# Patient Record
Sex: Female | Born: 1955 | Race: White | Hispanic: No | Smoking: Never smoker
Health system: Southern US, Community
[De-identification: ages and names within clinical notes are randomized; demographics above are authoritative.]

## PROBLEM LIST (undated history)

## (undated) DIAGNOSIS — E079 Disorder of thyroid, unspecified: Secondary | ICD-10-CM

## (undated) DIAGNOSIS — I10 Essential (primary) hypertension: Secondary | ICD-10-CM

## (undated) DIAGNOSIS — M810 Age-related osteoporosis without current pathological fracture: Secondary | ICD-10-CM

## (undated) DIAGNOSIS — E039 Hypothyroidism, unspecified: Secondary | ICD-10-CM

## (undated) HISTORY — DX: Hypothyroidism, unspecified: E03.9

## (undated) HISTORY — DX: Essential (primary) hypertension: I10

## (undated) HISTORY — PX: TONSILLECTOMY: SUR1361

## (undated) HISTORY — DX: Disorder of thyroid, unspecified: E07.9

## (undated) HISTORY — PX: CATARACT EXTRACTION: SUR2

## (undated) HISTORY — PX: COLONOSCOPY: SHX174

## (undated) HISTORY — PX: HEMORRHOID SURGERY: SHX153

## (undated) HISTORY — DX: Age-related osteoporosis without current pathological fracture: M81.0

---

## 2003-12-07 ENCOUNTER — Other Ambulatory Visit: Admission: RE | Admit: 2003-12-07 | Discharge: 2003-12-07 | Payer: Self-pay | Admitting: Family Medicine

## 2004-03-10 ENCOUNTER — Encounter: Admission: RE | Admit: 2004-03-10 | Discharge: 2004-03-10 | Payer: Self-pay | Admitting: Family Medicine

## 2004-03-23 ENCOUNTER — Encounter: Admission: RE | Admit: 2004-03-23 | Discharge: 2004-03-23 | Payer: Self-pay | Admitting: Family Medicine

## 2004-10-18 ENCOUNTER — Encounter: Admission: RE | Admit: 2004-10-18 | Discharge: 2004-10-18 | Payer: Self-pay | Admitting: Family Medicine

## 2004-12-29 ENCOUNTER — Other Ambulatory Visit: Admission: RE | Admit: 2004-12-29 | Discharge: 2004-12-29 | Payer: Self-pay | Admitting: Family Medicine

## 2005-01-02 ENCOUNTER — Encounter: Admission: RE | Admit: 2005-01-02 | Discharge: 2005-01-02 | Payer: Self-pay | Admitting: Family Medicine

## 2005-08-03 ENCOUNTER — Encounter: Admission: RE | Admit: 2005-08-03 | Discharge: 2005-08-03 | Payer: Self-pay | Admitting: Family Medicine

## 2006-03-01 ENCOUNTER — Other Ambulatory Visit: Admission: RE | Admit: 2006-03-01 | Discharge: 2006-03-01 | Payer: Self-pay | Admitting: Family Medicine

## 2009-04-15 ENCOUNTER — Encounter: Payer: Self-pay | Admitting: Family Medicine

## 2009-04-15 ENCOUNTER — Encounter: Admission: RE | Admit: 2009-04-15 | Discharge: 2009-04-15 | Payer: Self-pay | Admitting: Internal Medicine

## 2009-04-21 ENCOUNTER — Encounter: Admission: RE | Admit: 2009-04-21 | Discharge: 2009-04-21 | Payer: Self-pay | Admitting: Internal Medicine

## 2010-08-23 ENCOUNTER — Ambulatory Visit: Payer: Self-pay | Admitting: Family Medicine

## 2010-08-23 DIAGNOSIS — J069 Acute upper respiratory infection, unspecified: Secondary | ICD-10-CM | POA: Insufficient documentation

## 2010-08-23 DIAGNOSIS — E039 Hypothyroidism, unspecified: Secondary | ICD-10-CM | POA: Insufficient documentation

## 2010-08-31 ENCOUNTER — Ambulatory Visit: Payer: Self-pay | Admitting: Family Medicine

## 2010-08-31 LAB — CONVERTED CEMR LAB
Bilirubin Urine: NEGATIVE
Blood in Urine, dipstick: NEGATIVE
Glucose, Urine, Semiquant: NEGATIVE
Ketones, urine, test strip: NEGATIVE
Nitrite: NEGATIVE
Protein, U semiquant: NEGATIVE
Specific Gravity, Urine: <1.005
Urobilinogen, UA: 0.2
WBC Urine, dipstick: NEGATIVE
pH: 7.5

## 2010-09-04 LAB — CONVERTED CEMR LAB
ALT: 14 units/L (ref 0–35)
AST: 17 units/L (ref 0–37)
Albumin: 4.3 g/dL (ref 3.5–5.2)
Alkaline Phosphatase: 55 units/L (ref 39–117)
BUN: 16 mg/dL (ref 6–23)
Basophils Absolute: 0 10*3/uL (ref 0.0–0.1)
Basophils Relative: 0.8 % (ref 0.0–3.0)
Bilirubin, Direct: 0.1 mg/dL (ref 0.0–0.3)
CO2: 30 meq/L (ref 19–32)
Calcium: 9.6 mg/dL (ref 8.4–10.5)
Chloride: 105 meq/L (ref 96–112)
Cholesterol: 211 mg/dL — ABNORMAL HIGH (ref 0–200)
Creatinine, Ser: 0.7 mg/dL (ref 0.4–1.2)
Direct LDL: 110.1 mg/dL
Eosinophils Absolute: 0.2 10*3/uL (ref 0.0–0.7)
Eosinophils Relative: 6.6 % — ABNORMAL HIGH (ref 0.0–5.0)
GFR calc non Af Amer: 100.09 mL/min (ref >60)
Glucose, Bld: 105 mg/dL — ABNORMAL HIGH (ref 70–99)
HCT: 37 % (ref 36.0–46.0)
HDL: 95 mg/dL (ref >39.00)
Hemoglobin: 13 g/dL (ref 12.0–15.0)
Lymphocytes Relative: 29.8 % (ref 12.0–46.0)
Lymphs Abs: 1 10*3/uL (ref 0.7–4.0)
MCHC: 35.2 g/dL (ref 30.0–36.0)
MCV: 89.1 fL (ref 78.0–100.0)
Monocytes Absolute: 0.2 10*3/uL (ref 0.1–1.0)
Monocytes Relative: 6 % (ref 3.0–12.0)
Neutro Abs: 1.8 10*3/uL (ref 1.4–7.7)
Neutrophils Relative %: 56.8 % (ref 43.0–77.0)
Platelets: 159 10*3/uL (ref 150.0–400.0)
Potassium: 4.4 meq/L (ref 3.5–5.1)
RBC: 4.15 M/uL (ref 3.87–5.11)
RDW: 12.8 % (ref 11.5–14.6)
Sodium: 140 meq/L (ref 135–145)
T3, Free: 2.9 pg/mL (ref 2.3–4.2)
TSH: 0.69 microintl units/mL (ref 0.35–5.50)
Total Bilirubin: 0.8 mg/dL (ref 0.3–1.2)
Total CHOL/HDL Ratio: 2
Total Protein: 6.8 g/dL (ref 6.0–8.3)
Triglycerides: 51 mg/dL (ref 0.0–149.0)
VLDL: 10.2 mg/dL (ref 0.0–40.0)
WBC: 3.3 10*3/uL — ABNORMAL LOW (ref 4.5–10.5)

## 2010-09-08 ENCOUNTER — Telehealth (INDEPENDENT_AMBULATORY_CARE_PROVIDER_SITE_OTHER): Payer: Self-pay | Admitting: *Deleted

## 2010-10-05 ENCOUNTER — Encounter: Payer: Self-pay | Admitting: Family Medicine

## 2010-10-05 ENCOUNTER — Ambulatory Visit: Payer: Self-pay | Admitting: Family Medicine

## 2010-10-05 ENCOUNTER — Other Ambulatory Visit: Admission: RE | Admit: 2010-10-05 | Discharge: 2010-10-05 | Payer: Self-pay | Admitting: Family Medicine

## 2010-10-05 DIAGNOSIS — M810 Age-related osteoporosis without current pathological fracture: Secondary | ICD-10-CM | POA: Insufficient documentation

## 2010-10-05 LAB — CONVERTED CEMR LAB: Pap Smear: NEGATIVE

## 2011-01-04 NOTE — Assessment & Plan Note (Signed)
Summary: new to estab/cbs   Vital Signs:  Patient profile:   55 year old female Height:      63.5 inches Weight:      132.0 pounds BMI:     23.10 Temp:     99.9 degrees F oral Pulse rate:   88 / minute Pulse rhythm:   regular BP sitting:   140 / 74  (left arm) Cuff size:   regular  Vitals Entered By: Almeta Monas CMA [facility]) (August 23, 2010 10:02 AM) CC: New est care. C/o sinus headache and congestion, URI symptoms Comments Colonoscopy  in 2008, mammaogram in 2010 and pap in 2010   History of Present Illness: Pt here to establish and have thyroid checked .   Pt notices her hair is getting thin and her nails are brittle.           This is a 55 year old woman who presents with URI symptoms.  The symptoms began 1 day ago.  The patient complains of nasal congestion, but denies clear nasal discharge, purulent nasal discharge, sore throat, dry cough, productive cough, earache, and sick contacts.  The patient also reports itchy watery eyes and sneezing.  The patient denies itchy throat, seasonal symptoms, response to antihistamine, headache, muscle aches, and severe fatigue.  The patient denies the following risk factors for Strep sinusitis: unilateral facial pain, unilateral nasal discharge, poor response to decongestant, double sickening, tooth pain, Strep exposure, tender adenopathy, and absence of cough.    Preventive Screening-Counseling & Management  Alcohol-Tobacco     Alcohol drinks/day: 1     Alcohol type: wine     Smoking Status: never  Caffeine-Diet-Exercise     Caffeine use/day: 2     Does Patient Exercise: no     Exercise Counseling: to improve exercise regimen      Drug Use:  no.    Current Medications (verified): 1)  Levoxyl 125 Mcg Tabs (Levothyroxine Sodium) .Marland Kitchen.. 1 By Mouth Once Daily 2)  Mvi .Marland Kitchen.. 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 Family History Hypertension--Mother and Brother Family History of Prostate CA 1st degree relative <50-- Father Family History Diabetes 1st degree relative  Social History: Reviewed history and no changes required. 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  Otoscopic examination reveals clear canals, tympanic membranes are intact bilaterally without bulging, retraction, inflammation or discharge. Hearing is grossly normal bilaterally. Nose:  no external deformity, mucosal erythema, and mucosal edema.   Mouth:  Oral mucosa and oropharynx without lesions or exudates.  Teeth in good repair. Neck:  No deformities, masses, or tenderness noted. Lungs:  Normal respiratory effort, chest expands symmetrically. Lungs are clear to auscultation, no crackles or wheezes. Heart:  Normal rate and regular rhythm. S1 and S2 normal  without gallop, murmur, click, rub or other extra sounds. Cervical Nodes:  ant cervical nodes slightly enlarged--nontender Psych:  Cognition and judgment appear intact. Alert and cooperative with normal attention span and concentration. No apparent delusions, illusions, hallucinations   Impression & Recommendations:  Problem # 1:  URI (ICD-465.9)  veramyst  astepro claritin  Her updated medication list for this problem includes:    Claritin 10 Mg Tabs (Loratadine) .Marland Kitchen... 1 by mouth once daily  Problem # 2:  HYPOTHYROIDISM (ICD-244.9)  Her updated medication list for this problem includes:    Levoxyl 125 Mcg Tabs (Levothyroxine sodium) .Marland Kitchen... 1 by mouth once daily  Complete Medication List: 1)  Levoxyl 125 Mcg Tabs (Levothyroxine sodium) .Marland Kitchen.. 1 by mouth once daily 2)  Mvi  .Marland Kitchen.. 1 by mouth qd 3)  Calcium 600 1500 Mg Tabs (Calcium carbonate) .Marland Kitchen.. 1 by mouth two times a day 4)  Vitamin D3 1000 Unit Caps (Cholecalciferol) .Marland Kitchen.. 1 by mouth once daily 5)  Veramyst 27.5 Mcg/spray Susp (Fluticasone furoate) .... 2 sprays each nostril once daily 6)  Astepro 0.15 % Soln (Azelastine hcl) .... 2 spray each nostril once daily 7)  Claritin 10 Mg Tabs (Loratadine) .Marland Kitchen.. 1 by mouth once daily  Patient Instructions: 1)  fasting labs  v70.0 244.9  --cbcd, hep, lipid, bmp, TSH, free T3 ,  free T4 ,  UA  2)  schedule Cpe  Colonoscopy Result Date:  04/17/2006 Colonoscopy Result:  normal-- HP Colonoscopy Next Due:  10 yr  Appended Document: new to estab/cbs    Clinical Lists Changes  Observations: Added new observation of MAMMO DUE: 04/15/2010 (08/24/2010 13:45) Added new observation of LAST MAM DAT: 04/15/2009 (04/15/2009 13:45) Added new observation of MAMMOGRAM: normal (04/15/2009 13:45)      Mammogram Result Date:  04/15/2009 Mammogram Result:  normal Mammogram Next Due:  1 yr

## 2011-01-04 NOTE — Progress Notes (Signed)
Summary: Questions about labs 10/7, 10/10  Phone Note Call from Patient   Caller: Patient Details for Reason: QUESTIONS ABOUT LABS Summary of Call: Mssg from pt and she wanted to discuss labs.   Almeta Monas CMA [facility])  September 08, 2010 9:32 AM  Left message to call back.     Almeta Monas CMA [facility])  September 08, 2010 9:32 AM    Follow-up for Phone Call        pt wanted to know if you could think about adjusting her meds, per pt she feels like her Thyroid values are a little on the low side. Adv pt that her labs were wnl, pt voiced understanding but sayd her hair is still shedding and thinks her meds need to be adjusted. Pt also did not want to wait three months to have thyroid rechecked...c/b [phone] Please advise Follow-up by: Almeta Monas CMA [facility]),  September 08, 2010 9:56 AM  Additional Follow-up for Phone Call Additional follow up Details #1::        if I increase med---the TSH will be lower and she will be hyperthyroid---when the TSH is low that is hyperthyroid when it is hight that is hypothyroid---I 'll be happy to refer her to a specialist for her hair loss but it is not her thyroid.   Additional Follow-up by: Loreen Freud DO,  September 08, 2010 10:05 AM    Additional Follow-up for Phone Call Additional follow up Details #2::    Left message to call back..... Almeta Monas CMA [facility])  September 08, 2010 11:43 AM  Left message to call back...Marland KitchenMarland KitchenMarland Kitchen Almeta Monas CMA [facility])  September 11, 2010 12:01 PM   Additional Follow-up for Phone Call Additional follow up Details #3:: Details for Additional Follow-up Action Taken: spk with pt and made her aware of Dr.Lowne recommendations,pt did not agree. Thinks meds need to be adjusted. She  said she will talk to her husband about seeing and Endocrinologist. Will call back if she needs a referral. Offered appt to discuss with Dr.Lowne and pt declined. Call ended Additional Follow-up by: Almeta Monas CMA [facility]),  September 11, 2010 3:27  PM

## 2011-01-04 NOTE — Assessment & Plan Note (Signed)
Summary: cpx/cbs   Vital Signs:  Patient profile:   55 year old female Height:      63.5 inches Weight:      133.8 pounds Temp:     97.4 degrees F oral Pulse rate:   60 / minute Pulse rhythm:   regular BP sitting:   120 / 72  (left arm) Cuff size:   regular  Vitals Entered By: Almeta Monas CMA [facility]) (October 05, 2010 8:39 AM) CC: CPX WITH PAP--- Declined Flu vaccine   History of Present Illness: Pt here for cpe and pap --labs done last visit.   Labs reviewed with pt.   No new complaints.    Preventive Screening-Counseling & Management  Alcohol-Tobacco     Alcohol drinks/day: 1     Alcohol type: wine     Smoking Status: never  Caffeine-Diet-Exercise     Caffeine use/day: 2     Does Patient Exercise: no     Exercise Counseling: to improve exercise regimen  Hep-HIV-STD-Contraception     Dental Visit-last 6 months yes     Dental Care Counseling: not indicated; dental care within six months     SBE monthly: no     SBE Education/Counseling: to perform regular SBE      Sexual History:  currently monogamous.    Problems Prior to Update: 1)  Preventive Health Care  (ICD-V70.0) 2)  Osteopenia  (ICD-733.90) 3)  Uri  (ICD-465.9) 4)  Family History Diabetes 1st Degree Relative  (ICD-V18.0) 5)  Hypothyroidism  (ICD-244.9)  Medications Prior to Update: 1)  Levoxyl 125 Mcg Tabs (Levothyroxine Sodium) .Marland Kitchen.. 1 By Mouth Once Daily 2)  Mvi .Marland Kitchen.. 1 By Mouth Qd 3)  Calcium 600 1500 Mg Tabs (Calcium Carbonate) .Marland Kitchen.. 1 By Mouth Two Times A Day 4)  Vitamin D3 1000 Unit Caps (Cholecalciferol) .Marland Kitchen.. 1 By Mouth Once Daily 5)  Veramyst 27.5 Mcg/spray Susp (Fluticasone Furoate) .... 2 Sprays Each Nostril Once Daily 6)  Astepro 0.15 % Soln (Azelastine Hcl) .... 2 Spray Each Nostril Once Daily 7)  Claritin 10 Mg Tabs (Loratadine) .Marland Kitchen.. 1 By Mouth Once Daily  Current Medications (verified): 1)  Levoxyl 125 Mcg Tabs (Levothyroxine Sodium) .Marland Kitchen.. 1 By Mouth Once Daily 2)  Mvi .Marland Kitchen.. 1 By Mouth  Qd 3)  Calcium 600 1500 Mg Tabs (Calcium Carbonate) .Marland Kitchen.. 1 By Mouth Two Times A Day 4)  Vitamin D3 1000 Unit Caps (Cholecalciferol) .Marland Kitchen.. 1 By Mouth Once Daily 5)  Veramyst 27.5 Mcg/spray Susp (Fluticasone Furoate) .... 2 Sprays Each Nostril Once Daily 6)  Astepro 0.15 % Soln (Azelastine Hcl) .... 2 Spray Each Nostril Once Daily 7)  Claritin 10 Mg Tabs (Loratadine) .Marland Kitchen.. 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 Family History Hypertension--Mother and Brother Family History of Prostate CA 1st degree relative <50-- Father Family History Diabetes 1st degree relative  Social History: Reviewed history from 08/23/2010 and no changes required. 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 Eyes:  Denies blurring, discharge, double vision, eye irritation, eye pain, halos, itching,  light sensitivity, red eye, vision loss-1 eye, and vision loss-both eyes; optho-  + contacts---q1y. ENT:  Denies decreased hearing, difficulty swallowing, ear discharge, earache, hoarseness, nasal congestion, nosebleeds, postnasal drainage, ringing in ears, sinus pressure, and sore throat. CV:  Denies bluish discoloration of lips or nails, chest pain or discomfort, difficulty breathing at night, difficulty breathing while lying down, fainting, fatigue, leg cramps with exertion, lightheadness, near fainting, palpitations, shortness of breath with exertion, swelling of feet, swelling of hands, and weight gain. Resp:  Denies chest discomfort, chest pain with inspiration, cough, coughing up blood, excessive snoring, hypersomnolence, morning headaches, pleuritic, shortness of breath, sputum productive, and wheezing. GI:  Denies abdominal pain, bloody stools, change in bowel habits, constipation, dark tarry stools, diarrhea, excessive appetite, gas, hemorrhoids, indigestion, loss of appetite, nausea, vomiting, vomiting blood, and yellowish skin color. GU:  Denies abnormal vaginal bleeding, decreased libido, discharge, dysuria, genital sores, hematuria, incontinence, nocturia, urinary frequency, and urinary hesitancy. MS:  Denies joint pain, joint redness, joint swelling, loss of strength, low back pain, mid back pain, muscle aches, muscle , cramps, muscle weakness, stiffness, and thoracic pain. Derm:  Denies changes in color of skin, changes in nail beds, dryness, excessive perspiration, flushing, hair loss, insect bite(s), itching, lesion(s), poor wound healing, and rash; derm -- Dr Yetta Barre. Neuro:  Denies brief paralysis, difficulty with concentration, disturbances in coordination, falling down, headaches, inability to speak, memory loss, numbness, poor balance, seizures, sensation of room spinning, tingling, tremors, visual disturbances, and weakness. Psych:  Denies alternate hallucination ( auditory/visual),  anxiety, depression, easily angered, easily tearful, irritability, mental problems, panic attacks, sense of great danger, suicidal thoughts/plans, thoughts of violence, unusual visions or sounds, and thoughts /plans of harming others. Endo:  Denies cold intolerance, excessive hunger, excessive thirst, excessive urination, heat intolerance, polyuria, and weight change. Heme:  Denies abnormal bruising, bleeding, enlarge lymph nodes, fevers, pallor, and skin discoloration. Allergy:  Denies hives or rash, itching eyes, persistent infections, seasonal allergies, and sneezing.  Physical Exam  General:  Well-developed,well-nourished,in no acute distress; alert,appropriate and cooperative throughout examination Head:  Normocephalic and atraumatic without obvious abnormalities. No apparent alopecia or balding. Eyes:  pupils equal, pupils round, pupils reactive to light, and no injection.   Ears:  External ear exam shows no significant lesions or deformities.  Otoscopic examination reveals clear canals, tympanic membranes are intact bilaterally without bulging, retraction, inflammation or discharge. Hearing is grossly normal bilaterally. Nose:  External nasal examination shows no deformity or inflammation. Nasal mucosa are pink and moist without lesions or exudates. Mouth:  Oral mucosa and oropharynx without lesions or exudates.  Teeth in good repair. Neck:  No deformities, masses, or tenderness noted.no carotid bruits.   Chest Wall:  No deformities, masses, or tenderness noted. Breasts:  No mass, nodules, thickening, tenderness, bulging, retraction, inflamation, nipple discharge or skin changes noted.   Lungs:  Normal respiratory effort, chest expands symmetrically. Lungs are clear to auscultation, no crackles or wheezes. Heart:  normal rate and no murmur.   Abdomen:  Bowel sounds positive,abdomen soft and non-tender without masses, organomegaly or hernias noted. Rectal:  No external abnormalities noted.  Normal sphincter tone. No rectal masses or tenderness.  heme neg brown stool Genitalia:  Pelvic Exam:        External: normal female genitalia without lesions or masses        Vagina: normal without lesions or masses        Cervix: normal without lesions or masses        Adnexa: normal bimanual exam without masses or fullness        Uterus: normal by palpation        Pap smear: performed Msk:  normal ROM, no joint tenderness, no joint swelling, no joint warmth, no redness over joints, no joint deformities, no joint instability, and no crepitation.   Pulses:  R posterior tibial normal, R dorsalis pedis normal, R carotid normal, L posterior tibial normal, L dorsalis pedis normal, and L carotid normal.   Extremities:  No clubbing, cyanosis, edema, or deformity noted with normal full range of motion of all joints.   Neurologic:  No cranial nerve deficits noted. Station and gait are normal. Plantar reflexes are down-going bilaterally. DTRs are symmetrical throughout. Sensory, motor and coordinative functions appear intact. Skin:  Intact without suspicious lesions or rashes Cervical Nodes:  No lymphadenopathy noted Axillary Nodes:  No palpable lymphadenopathy Psych:  Cognition and judgment appear intact. Alert and cooperative with normal attention span and concentration. No apparent delusions, illusions, hallucinations   Impression & Recommendations:  Problem # 1:  PREVENTIVE HEALTH CARE (ICD-V70.0)  labs reviewed with pt ghm utd  check mammo check on bmd  Orders: EKG w/ Interpretation (93000)  Problem # 2:  OSTEOPENIA (ICD-733.90)  Pt will check to see when last bmd done Her updated medication list for this problem includes:    Calcium 600 1500 Mg Tabs (Calcium carbonate) .Marland Kitchen... 1 by mouth two times a day    Vitamin D3 1000 Unit Caps (Cholecalciferol) .Marland Kitchen... 1 by mouth once daily  Orders: EKG w/ Interpretation (93000)  Problem # 3:  HYPOTHYROIDISM (ICD-244.9)  Her updated  medication list for this problem includes:    Levoxyl 125 Mcg Tabs (Levothyroxine sodium) .Marland Kitchen... 1 by mouth once daily  Labs Reviewed: TSH: 0.69 (08/31/2010)    Chol: 211 (08/31/2010)   HDL: 95.00 (08/31/2010)   TG: 51.0 (08/31/2010)  Orders: EKG w/ Interpretation (93000)  Complete Medication List: 1)  Levoxyl 125 Mcg Tabs (Levothyroxine sodium) .Marland Kitchen.. 1 by mouth once daily 2)  Mvi  .Marland Kitchen.. 1 by mouth qd 3)  Calcium 600 1500 Mg Tabs (Calcium carbonate) .Marland Kitchen.. 1 by mouth two times a day 4)  Vitamin D3 1000 Unit Caps (Cholecalciferol) .Marland Kitchen.. 1 by mouth once daily 5)  Veramyst 27.5 Mcg/spray Susp (Fluticasone furoate) .... 2 sprays each nostril once daily 6)  Astepro 0.15 % Soln (Azelastine hcl) .... 2 spray each nostril once daily 7)  Claritin 10 Mg Tabs (Loratadine) .Marland Kitchen.. 1 by mouth once daily  Other Orders: Tdap => 67yrs IM (09811) Admin 1st Vaccine (91478)  Patient Instructions: 1)  fasting labs 3 months  272.4  [facility]   Orders Added: 1)  Tdap => 20yrs IM [90715] 2)  Admin 1st Vaccine [90471] 3)  Est. Patient 40-64 years [99396] 4)  EKG w/ Interpretation [93000]   Immunizations Administered:  Tetanus Vaccine:    Vaccine Type: Tdap    Site: right deltoid    Mfr: Merck    Dose:  0.5 ml    Route: IM    Given by: Almeta Monas CMA (AAMA)    Exp. Date: 09/21/2012    Lot #: [id number]    VIS given: 10/20/08 version given October 05, 2010.   Immunizations Administered:  Tetanus Vaccine:    Vaccine Type: Tdap    Site: right deltoid    Mfr: Merck    Dose: 0.5 ml    Route: IM    Given by: Almeta Monas CMA (AAMA)    Exp. Date: 09/21/2012    Lot #: [id number]    VIS given: 10/20/08 version given October 05, 2010.  Flu Vaccine Result Date:  09/14/2010 Flu Vaccine Result:  given Flu Vaccine Next Due:  1 yr TD Result Date:  10/05/2010 TD Result:  given TD Next Due:  10 yr

## 2011-01-08 ENCOUNTER — Encounter (INDEPENDENT_AMBULATORY_CARE_PROVIDER_SITE_OTHER): Payer: Self-pay | Admitting: *Deleted

## 2011-01-08 ENCOUNTER — Other Ambulatory Visit (INDEPENDENT_AMBULATORY_CARE_PROVIDER_SITE_OTHER): Payer: BC Managed Care – PPO

## 2011-01-08 DIAGNOSIS — E039 Hypothyroidism, unspecified: Secondary | ICD-10-CM

## 2011-01-08 DIAGNOSIS — Z0000 Encounter for general adult medical examination without abnormal findings: Secondary | ICD-10-CM

## 2011-01-23 ENCOUNTER — Telehealth (INDEPENDENT_AMBULATORY_CARE_PROVIDER_SITE_OTHER): Payer: Self-pay | Admitting: *Deleted

## 2011-01-30 NOTE — Progress Notes (Signed)
Summary: Results of [facility]--  left message to call back.... Almeta Monas CMA [facility])  January 23, 2011 9:03 AM      ---- Converted from flag ---- ---- 01/21/2011 6:44 PM, Loreen Freud DO wrote: her [facility] was great!  she can make appointment to go over them or just pick up the book and make appointment if she has questions. ------------------------------  Phone Note Call from Patient   Caller: Patient Summary of Call: Pt would like to pick of labs first then if she has any question she will schedule OV to discuss............Marland KitchenFelecia Deloach CMA  January 24, 2011 8:19 AM   Follow-up for Phone Call        [facility] book left at check In.... Almeta Monas CMA [facility] Dull)  January 24, 2011 8:26 AM

## 2011-02-28 ENCOUNTER — Other Ambulatory Visit: Payer: Self-pay | Admitting: Family Medicine

## 2011-02-28 DIAGNOSIS — Z1231 Encounter for screening mammogram for malignant neoplasm of breast: Secondary | ICD-10-CM

## 2011-03-08 ENCOUNTER — Other Ambulatory Visit: Payer: Self-pay

## 2011-03-08 MED ORDER — LEVOTHYROXINE SODIUM 125 MCG PO TABS: 125.0000 ug | ORAL_TABLET | Freq: Every day | ORAL | Status: DC

## 2011-04-17 ENCOUNTER — Telehealth: Payer: Self-pay | Admitting: *Deleted

## 2011-04-17 DIAGNOSIS — M8580 Other specified disorders of bone density and structure, unspecified site: Secondary | ICD-10-CM

## 2011-04-17 DIAGNOSIS — Z1231 Encounter for screening mammogram for malignant neoplasm of breast: Secondary | ICD-10-CM

## 2011-04-17 NOTE — Telephone Encounter (Signed)
Pt called stating that she would like to have bone density and mammogram order sent to [facility] so she can have them done on the same day. Pt aware orders put in.

## 2011-05-25 ENCOUNTER — Telehealth: Payer: Self-pay | Admitting: *Deleted

## 2011-05-25 NOTE — Telephone Encounter (Signed)
error 

## 2011-05-25 NOTE — Telephone Encounter (Signed)
Addended by: Candie Echevaria L on: 05/25/2011 11:38 AM   Modules accepted: Orders

## 2011-06-15 ENCOUNTER — Other Ambulatory Visit: Payer: BC Managed Care – PPO

## 2011-06-15 ENCOUNTER — Ambulatory Visit
Admission: RE | Admit: 2011-06-15 | Discharge: 2011-06-15 | Disposition: A | Discharge status: Home / Self Care | Payer: BC Managed Care – PPO | Source: Ambulatory Visit | Attending: Family Medicine | Admitting: Family Medicine

## 2011-07-06 ENCOUNTER — Ambulatory Visit
Admission: RE | Admit: 2011-07-06 | Discharge: 2011-07-06 | Disposition: A | Discharge status: Home / Self Care | Payer: BC Managed Care – PPO | Source: Ambulatory Visit | Attending: Family Medicine | Admitting: Family Medicine

## 2011-07-06 DIAGNOSIS — M8580 Other specified disorders of bone density and structure, unspecified site: Secondary | ICD-10-CM

## 2011-07-20 ENCOUNTER — Telehealth: Payer: Self-pay | Admitting: Family Medicine

## 2011-07-20 NOTE — Telephone Encounter (Signed)
Atelvia 35  1 po qweek  #4  11 refills---coupons in closet

## 2011-07-20 NOTE — Telephone Encounter (Signed)
Pt states that she does not like the fosamax. Pt is requesting another med..Please advise

## 2011-07-23 NOTE — Telephone Encounter (Signed)
Left message to call office

## 2011-07-24 NOTE — Telephone Encounter (Signed)
Left message to call office

## 2011-07-25 MED ORDER — RISEDRONATE SODIUM 35 MG PO TBEC: 1.0000 | DELAYED_RELEASE_TABLET | ORAL | Status: DC

## 2011-07-25 NOTE — Telephone Encounter (Signed)
Left message to call office

## 2011-07-25 NOTE — Telephone Encounter (Signed)
Discussed with patient and she voiced understanding. Rx sent to pharmacy     KP

## 2011-07-30 ENCOUNTER — Telehealth: Payer: Self-pay | Admitting: Family Medicine

## 2011-07-30 ENCOUNTER — Encounter: Payer: Self-pay | Admitting: Family Medicine

## 2011-07-30 NOTE — Telephone Encounter (Signed)
Fosamax is what I ordered to begin with

## 2011-07-30 NOTE — Telephone Encounter (Signed)
Received prior auth from pharmacy pt can try Alendronate, Fosamax plus D, or Boniva. Since she hasn't tried any other medication pt would have to try one of these. Pls advise.

## 2011-07-30 NOTE — Telephone Encounter (Signed)
msgs left for a return call     KP

## 2011-07-31 NOTE — Telephone Encounter (Signed)
Pt tried fosamax and didn"t "like it"---- find out exactly why and maybe we can get it approved then---thankyou

## 2011-07-31 NOTE — Telephone Encounter (Signed)
Dr. Laury Axon see phone note from 8/17 pt doesn't like fosamax so your recommendation was atelvia which isn't approved.

## 2011-08-13 NOTE — Telephone Encounter (Signed)
mssg left with guy who answered    KP

## 2011-08-15 NOTE — Telephone Encounter (Signed)
mssg left to call the office    KP 

## 2011-08-15 NOTE — Telephone Encounter (Signed)
Prior auth faxed awaiting fax.

## 2011-08-17 NOTE — Telephone Encounter (Signed)
discussed with patent and she stated she did not want to get the Actonel and the Fosamax caused her joint pain. She wanted to know some other options and I discussed Reclast and Prolia. She stated her insurance will hanged on October 1st and she will decide then, I advised I would mailed her some patient information on both Prolia and Reclast and she stated she would call back and let us know when she makes her choice      KP

## 2011-09-05 LAB — HM MAMMOGRAPHY: HM Mammogram: NORMAL

## 2011-10-04 ENCOUNTER — Other Ambulatory Visit: Payer: Self-pay | Admitting: Family Medicine

## 2012-01-21 ENCOUNTER — Ambulatory Visit (INDEPENDENT_AMBULATORY_CARE_PROVIDER_SITE_OTHER): Payer: BC Managed Care – PPO | Admitting: Family Medicine

## 2012-01-21 VITALS — BP 127/72 | HR 76 | Temp 99.0°F | Resp 16 | Ht 63.5 in | Wt 141.4 lb

## 2012-01-21 DIAGNOSIS — E039 Hypothyroidism, unspecified: Secondary | ICD-10-CM | POA: Insufficient documentation

## 2012-01-21 DIAGNOSIS — J019 Acute sinusitis, unspecified: Secondary | ICD-10-CM

## 2012-01-21 DIAGNOSIS — J069 Acute upper respiratory infection, unspecified: Secondary | ICD-10-CM

## 2012-01-21 MED ORDER — FLUTICASONE PROPIONATE 50 MCG/ACT NA SUSP: NASAL | Status: DC

## 2012-01-21 MED ORDER — CEFDINIR 300 MG PO CAPS: 300.0000 mg | ORAL_CAPSULE | Freq: Two times a day (BID) | ORAL | Status: AC

## 2012-01-21 NOTE — Progress Notes (Signed)
  Subjective:    Patient ID: Linda Gay, female    DOB: Jun 07, 1956, 56 y.o.   MRN: [id number]  HPI First respiratory congestion and sinus infection for the past week. Has not been where running any fever at is like she has a lot of postnasal drainage. There's pressure her face. Cough is primarily from the drainage. She is a nonsmoker.  Her only regular medicine is the thyroid medication. She is due to recheck of that, for the dose was changed about   Review of Systems    as above Objective:   Physical Exam  Pleasant white female no acute distress. Her TMs are normal throat slightly erythematous neck supple without nodes. Her sinuses are tender on percussion no thyromegaly. Chest is clear to auscultation. Heart regular.      Assessment & Plan:  Sinusitis Hypothyroidism URI  Checked a TSH and treat her with antibiotics

## 2012-01-21 NOTE — Patient Instructions (Signed)
Drink lots of fluids. Use the medications as ordered. Return if worse  .Upper Respiratory Infection, Adult An upper respiratory infection (URI) is also known as the common cold. It is often caused by a type of germ (virus). Colds are easily spread (contagious). You can pass it to others by kissing, coughing, sneezing, or drinking out of the same glass. Usually, you get better in 1 or 2 weeks.  HOME CARE   Only take medicine as told by your doctor.   Use a warm mist humidifier or breathe in steam from a hot shower.   Drink enough water and fluids to keep your pee (urine) clear or pale yellow.   Get plenty of rest.   Return to work when your temperature is back to normal or as told by your doctor. You may use a face mask and wash your hands to stop your cold from spreading.  GET HELP RIGHT AWAY IF:   After the first few days, you feel you are getting worse.   You have questions about your medicine.   You have chills, shortness of breath, or brown or red spit (mucus).   You have yellow or brown snot (nasal discharge) or pain in the face, especially when you bend forward.   You have a fever, puffy (swollen) neck, pain when you swallow, or white spots in the back of your throat.   You have a bad headache, ear pain, sinus pain, or chest pain.   You have a high-pitched whistling sound when you breathe in and out (wheezing).   You have a lasting cough or cough up blood.   You have sore muscles or a stiff neck.  MAKE SURE YOU:   Understand these instructions.   Will watch your condition.   Will get help right away if you are not doing well or get worse.  Document Released: 05/07/2008 Document Revised: 08/01/2011 Document Reviewed: 03/26/2011 [facility] Patient Information 2012 [place], [place].

## 2012-01-22 ENCOUNTER — Encounter: Payer: Self-pay | Admitting: Family Medicine

## 2012-01-22 LAB — TSH: TSH: 3.611 u[IU]/mL (ref 0.350–4.500)

## 2012-01-30 ENCOUNTER — Other Ambulatory Visit: Payer: Self-pay | Admitting: Family Medicine

## 2012-04-03 ENCOUNTER — Telehealth: Payer: Self-pay | Admitting: *Deleted

## 2012-04-03 NOTE — Telephone Encounter (Signed)
The pharmacist from [facility] called and stated that Levoxyl was on back order and unable to get in.  They would like to know if it can be changed to Synthroid?

## 2012-04-04 NOTE — Telephone Encounter (Signed)
It's ok with [place] if she is changed to Synthroid but that is up to her.

## 2012-04-05 NOTE — Telephone Encounter (Signed)
PHARMACY AND PT NOTIFIED

## 2012-04-05 NOTE — Telephone Encounter (Signed)
Pt is calling back to see if we have made a decision about  Her medication change since what she is currently taking now is unavailable  Best number [phone]

## 2012-06-20 ENCOUNTER — Ambulatory Visit (INDEPENDENT_AMBULATORY_CARE_PROVIDER_SITE_OTHER): Payer: BC Managed Care – PPO | Admitting: Family Medicine

## 2012-06-20 VITALS — BP 132/66 | HR 67 | Temp 98.3°F | Resp 16 | Ht 64.0 in | Wt 142.2 lb

## 2012-06-20 DIAGNOSIS — E039 Hypothyroidism, unspecified: Secondary | ICD-10-CM

## 2012-06-20 LAB — TSH: TSH: 2.743 u[IU]/mL (ref 0.350–4.500)

## 2012-06-20 MED ORDER — LEVOTHYROXINE SODIUM 100 MCG PO TABS: 100.0000 ug | ORAL_TABLET | Freq: Every day | ORAL | Status: DC

## 2012-06-20 NOTE — Progress Notes (Signed)
[facility] [address], [place] [address] [phone]  Date:  06/20/2012   Name:  Linda Gay   DOB:  02-Oct-1956   MRN:  [id number]  PCP:  Loreen Freud, DO    Chief Complaint: Hypothyroidism   History of Present Illness:  Linda Gay is a 56 y.o. very pleasant female patient who presents with the following:  Here today for a TSH check.  She had her dosage adjusted in December of 2012 due to a suppressed TSH (went from 125 to ).  Her weight is relatively stable and she is feeling well.  Still has a few days of her last rx left so she will not run out.   She is otherwise generally healthy   Patient Active Problem List  Diagnosis  . HYPOTHYROIDISM  . URI  . OSTEOPENIA  . Hypothyroid    No past medical history on file.  No past surgical history on file.  History  Substance Use Topics  . Smoking status: Never Smoker   . Smokeless tobacco: Not on file  . Alcohol Use: Not on file    No family history on file.  No Known Allergies  Medication list has been reviewed and updated.  Current Outpatient Prescriptions on File Prior to Visit  Medication Sig Dispense Refill  . LEVOXYL 100 MCG tablet TAKE ONE TABLET BY MOUTH DAILY  90 tablet  0  . DISCONTD: fluticasone (FLONASE) 50 MCG/ACT nasal spray Use 2 sprays each nostril twice daily for 3 days, then just once daily  16 g  1    Review of Systems:  As per HPI- otherwise negative.   Physical Examination: Filed Vitals:   06/20/12 0835  BP: 132/66  Pulse: 67  Temp: 98.3 F (36.8 C)  Resp: 16   Filed Vitals:   06/20/12 0835  Height: 5\' 4"  (1.626 m)  Weight: 142 lb 3.2 oz (64.501 kg)   Body mass index is 24.41 kg/(m^2). Ideal Body Weight: Weight in (lb) to have BMI = 25: 145.3   GEN: WDWN, NAD, Non-toxic, A & O x 3 HEENT: Atraumatic, Normocephalic. Neck supple. No masses, No LAD.  PEERL, EOMI, oropharynx wnl.  Ears and Nose: No external deformity. CV: RRR, No M/G/R. No JVD. No  thrill. No extra heart sounds. PULM: CTA B, no wheezes, crackles, rhonchi. No retractions. No resp. distress. No accessory muscle use. EXTR: No c/c/e NEURO Normal gait.  PSYCH: Normally interactive. Conversant. Not depressed or anxious appearing.  Calm demeanor.    Assessment and Plan: 1. Hypothyroidism  levothyroxine (LEVOXYL) 100 MCG tablet, TSH   Check TSH today.  Assuming ok will plan to recheck in about 6 months.  She had a normal TSH in February of this year.  Refilled her medication but she will not pick it up until we discuss her TSH    Abbe Amsterdam, MD

## 2012-11-04 ENCOUNTER — Other Ambulatory Visit: Payer: Self-pay | Admitting: Family Medicine

## 2013-03-23 ENCOUNTER — Ambulatory Visit (INDEPENDENT_AMBULATORY_CARE_PROVIDER_SITE_OTHER): Payer: BC Managed Care – PPO | Admitting: Emergency Medicine

## 2013-03-23 VITALS — BP 122/60 | HR 67 | Temp 98.1°F | Resp 16 | Ht 64.0 in | Wt 142.0 lb

## 2013-03-23 DIAGNOSIS — E039 Hypothyroidism, unspecified: Secondary | ICD-10-CM

## 2013-03-23 LAB — POCT CBC
Granulocyte percent: 63.1 %G (ref 37–80)
HCT, POC: 36.6 % — AB (ref 37.7–47.9)
Hemoglobin: 11.7 g/dL — AB (ref 12.2–16.2)
Lymph, poc: 1 (ref 0.6–3.4)
MCH, POC: 29 pg (ref 27–31.2)
MCHC: 32 g/dL (ref 31.8–35.4)
MCV: 90.7 fL (ref 80–97)
MID (cbc): 0.2 (ref 0–0.9)
MPV: 9.2 fL (ref 0–99.8)
POC Granulocyte: 2.1 (ref 2–6.9)
POC LYMPH PERCENT: 29.6 %L (ref 10–50)
POC MID %: 7.3 %M (ref 0–12)
Platelet Count, POC: 165 10*3/uL (ref 142–424)
RBC: 4.03 M/uL — AB (ref 4.04–5.48)
RDW, POC: 13.7 %
WBC: 3.3 10*3/uL — AB (ref 4.6–10.2)

## 2013-03-23 LAB — COMPREHENSIVE METABOLIC PANEL
ALT: 14 U/L (ref 0–35)
AST: 15 U/L (ref 0–37)
Albumin: 4.3 g/dL (ref 3.5–5.2)
Alkaline Phosphatase: 55 U/L (ref 39–117)
BUN: 13 mg/dL (ref 6–23)
CO2: 28 mEq/L (ref 19–32)
Calcium: 9.6 mg/dL (ref 8.4–10.5)
Chloride: 105 mEq/L (ref 96–112)
Creat: 0.73 mg/dL (ref 0.50–1.10)
Glucose, Bld: 104 mg/dL — ABNORMAL HIGH (ref 70–99)
Potassium: 4.1 mEq/L (ref 3.5–5.3)
Sodium: 140 mEq/L (ref 135–145)
Total Bilirubin: 0.6 mg/dL (ref 0.3–1.2)
Total Protein: 6.8 g/dL (ref 6.0–8.3)

## 2013-03-23 LAB — LIPID PANEL
Cholesterol: 216 mg/dL — ABNORMAL HIGH (ref 0–200)
HDL: 84 mg/dL (ref >39)
LDL Cholesterol: 117 mg/dL — ABNORMAL HIGH (ref 0–99)
Total CHOL/HDL Ratio: 2.6 Ratio
Triglycerides: 74 mg/dL (ref <150)
VLDL: 15 mg/dL (ref 0–40)

## 2013-03-23 LAB — TSH: TSH: 2.436 u[IU]/mL (ref 0.350–4.500)

## 2013-03-23 MED ORDER — LEVOTHYROXINE SODIUM 100 MCG PO TABS: 100.0000 ug | ORAL_TABLET | Freq: Every day | ORAL | Status: DC

## 2013-03-23 NOTE — Addendum Note (Signed)
Addended by: Carmelina Dane on: 03/23/2013 05:13 PM   Modules accepted: Orders

## 2013-03-23 NOTE — Addendum Note (Signed)
Addended by: Bronson Curb on: 03/23/2013 09:26 AM   Modules accepted: Orders

## 2013-03-23 NOTE — Progress Notes (Signed)
[facility] [address] [place] [address] [phone]  Date:  03/23/2013   Name:  Linda Gay   DOB:  Nov 17, 1956   MRN:  [id number]  PCP:  Loreen Freud, DO    Chief Complaint: Medication Refill   History of Present Illness:  Linda Gay is a 57 y.o. very pleasant female patient who presents with the following:  History of acquired hypothyroidism and on replacement.  Last labs in July.  Needs refill.  Wants to be referred to 104 for routine care.  Denies other complaint or health concern today.   Patient Active Problem List  Diagnosis  . HYPOTHYROIDISM  . URI  . OSTEOPENIA  . Hypothyroid    No past medical history on file.  No past surgical history on file.  History  Substance Use Topics  . Smoking status: Never Smoker   . Smokeless tobacco: Not on file  . Alcohol Use: No    No family history on file.  No Known Allergies  Medication list has been reviewed and updated.  Current Outpatient Prescriptions on File Prior to Visit  Medication Sig Dispense Refill  . levothyroxine (LEVOXYL) 100 MCG tablet Take 1 tablet (100 mcg total) by mouth daily.  90 tablet  2  . fluticasone (FLONASE) 50 MCG/ACT nasal spray as needed. Use 2 sprays each nostril twice daily for 3 days, then just once daily       No current facility-administered medications on file prior to visit.    Review of Systems:  As per HPI, otherwise negative.    Physical Examination: Filed Vitals:   03/23/13 0840  BP: 122/60  Pulse: 67  Temp: 98.1 F (36.7 C)  Resp: 16   Filed Vitals:   03/23/13 0840  Height: 5\' 4"  (1.626 m)  Weight: 142 lb (64.411 kg)   Body mass index is 24.36 kg/(m^2). Ideal Body Weight: Weight in (lb) to have BMI = 25: 145.3  GEN: WDWN, NAD, Non-toxic, A & O x 3 HEENT: Atraumatic, Normocephalic. Neck supple. No masses, No LAD. Ears and Nose: No external deformity. CV: RRR, No M/G/R. No JVD. No thrill. No extra heart sounds. PULM: CTA B, no  wheezes, crackles, rhonchi. No retractions. No resp. distress. No accessory muscle use. ABD: S, NT, ND, +BS. No rebound. No HSM. EXTR: No c/c/e NEURO Normal gait.  PSYCH: Normally interactive. Conversant. Not depressed or anxious appearing.  Calm demeanor.    Assessment and Plan: Labs Treat based on labs   Signed,  Phillips Odor, MD

## 2013-03-23 NOTE — Patient Instructions (Addendum)

## 2013-09-03 ENCOUNTER — Telehealth: Payer: Self-pay

## 2013-09-03 NOTE — Telephone Encounter (Signed)
HM UTD WE Flu vaccine-give upon arrival Due for MMG Would like PAP at today's visit Meds reconciled, pharmacy and allergies verified Would like to schedule lab appt to come in at 8am/unable to fast until 1030a

## 2013-09-04 ENCOUNTER — Encounter: Payer: Self-pay | Admitting: Family Medicine

## 2013-09-04 ENCOUNTER — Other Ambulatory Visit (HOSPITAL_COMMUNITY)
Admission: RE | Admit: 2013-09-04 | Discharge: 2013-09-04 | Disposition: A | Discharge status: Home / Self Care | Payer: BC Managed Care – PPO | Source: Ambulatory Visit | Attending: Family Medicine | Admitting: Family Medicine

## 2013-09-04 ENCOUNTER — Ambulatory Visit (INDEPENDENT_AMBULATORY_CARE_PROVIDER_SITE_OTHER): Payer: BC Managed Care – PPO | Admitting: Family Medicine

## 2013-09-04 VITALS — BP 122/72 | HR 68 | Temp 98.4°F | Resp 16 | Ht 63.0 in | Wt 143.4 lb

## 2013-09-04 DIAGNOSIS — Z1231 Encounter for screening mammogram for malignant neoplasm of breast: Secondary | ICD-10-CM

## 2013-09-04 DIAGNOSIS — Z01419 Encounter for gynecological examination (general) (routine) without abnormal findings: Secondary | ICD-10-CM

## 2013-09-04 DIAGNOSIS — Z0000 Encounter for general adult medical examination without abnormal findings: Secondary | ICD-10-CM

## 2013-09-04 DIAGNOSIS — N841 Polyp of cervix uteri: Secondary | ICD-10-CM

## 2013-09-04 DIAGNOSIS — Z79899 Other long term (current) drug therapy: Secondary | ICD-10-CM

## 2013-09-04 DIAGNOSIS — Z124 Encounter for screening for malignant neoplasm of cervix: Secondary | ICD-10-CM

## 2013-09-04 DIAGNOSIS — Z1151 Encounter for screening for human papillomavirus (HPV): Secondary | ICD-10-CM | POA: Insufficient documentation

## 2013-09-04 DIAGNOSIS — Z23 Encounter for immunization: Secondary | ICD-10-CM

## 2013-09-04 DIAGNOSIS — M899 Disorder of bone, unspecified: Secondary | ICD-10-CM

## 2013-09-04 NOTE — Patient Instructions (Addendum)
We'll notify you of your lab results and make any changes if needed Keep up the good work!  You look great! We'll call you with your bone density and mammo appts Someone will call you with your GYN appt Call with any questions or concerns Think of [place] as your home base Welcome!

## 2013-09-04 NOTE — Assessment & Plan Note (Signed)
Pap collected. 

## 2013-09-04 NOTE — Progress Notes (Signed)
  Subjective:    Patient ID: Linda Gay, female    DOB: July 13, 1956, 57 y.o.   MRN: [id number]  HPI New to establish.  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  Due for pap, mammo, DEXA.  UTD on colonoscopy 2007 in [place].   Review of Systems Patient reports no vision/ hearing changes, adenopathy,fever, weight change,  persistant/recurrent hoarseness , swallowing issues, chest pain, palpitations, edema, persistant/recurrent cough, hemoptysis, dyspnea (rest/exertional/paroxysmal nocturnal), gastrointestinal bleeding (melena, rectal bleeding), abdominal pain, significant heartburn, bowel changes, GU symptoms (dysuria, hematuria, incontinence), Gyn symptoms (abnormal  bleeding, pain),  syncope, focal weakness, memory loss, numbness & tingling, skin/hair/nail changes, abnormal bruising or bleeding, anxiety, or depression.     Objective:   Physical Exam  General Appearance:    Alert, cooperative, no distress, appears stated age  Head:    Normocephalic, without obvious abnormality, atraumatic  Eyes:    PERRL, conjunctiva/corneas clear, EOM's intact, fundi    benign, both eyes  Ears:    Normal TM's and external ear canals, both ears  Nose:   Nares normal, septum midline, mucosa normal, no drainage    or sinus tenderness  Throat:   Lips, mucosa, and tongue normal; teeth and gums normal  Neck:   Supple, symmetrical, trachea midline, no adenopathy;    Thyroid: no enlargement/tenderness/nodules  Back:     Symmetric, no curvature, ROM normal, no CVA tenderness  Lungs:     Clear to auscultation bilaterally, respirations unlabored  Chest Wall:    No tenderness or deformity   Heart:    Regular rate and rhythm, S1 and S2 normal, no murmur, rub   or gallop  Breast Exam:    No tenderness, masses, or nipple abnormality  Abdomen:     Soft, non-tender, bowel sounds active all four quadrants,    no masses, no organomegaly  Genitalia:    External genitalia normal, cervix with polyp at the os, no CMT, uterus in normal  size and position, adnexa w/out mass or tenderness, mucosa pink and moist, no lesions or discharge present  Rectal:    Normal external appearance  Extremities:   Extremities normal, atraumatic, no cyanosis or edema  Pulses:   2+ and symmetric all extremities  Skin:   Skin color, texture, turgor normal, no rashes or lesions  Lymph nodes:   Cervical, supraclavicular, and axillary nodes normal  Neurologic:   CNII-XII intact, normal strength, sensation and reflexes    throughout          Assessment & Plan:

## 2013-09-04 NOTE — Assessment & Plan Note (Signed)
Check Vit D and repeat DEXA 

## 2013-09-04 NOTE — Assessment & Plan Note (Signed)
Pt's PE WNL.  UTD on colonoscopy.  Pap collected today, referral made for mammo and DEXA.  Check labs.  Anticipatory guidance provided.

## 2013-09-08 ENCOUNTER — Other Ambulatory Visit: Payer: BC Managed Care – PPO

## 2013-09-08 LAB — BASIC METABOLIC PANEL
BUN: 12 mg/dL (ref 6–23)
CO2: 28 mEq/L (ref 19–32)
Calcium: 9.3 mg/dL (ref 8.4–10.5)
Chloride: 106 mEq/L (ref 96–112)
Creatinine, Ser: 0.7 mg/dL (ref 0.4–1.2)
GFR: 88.01 mL/min (ref >60.00)
Glucose, Bld: 97 mg/dL (ref 70–99)
Potassium: 3.8 mEq/L (ref 3.5–5.1)
Sodium: 141 mEq/L (ref 135–145)

## 2013-09-08 LAB — CBC WITH DIFFERENTIAL/PLATELET
Basophils Absolute: 0 10*3/uL (ref 0.0–0.1)
Basophils Relative: 0.7 % (ref 0.0–3.0)
Eosinophils Absolute: 0.2 10*3/uL (ref 0.0–0.7)
Eosinophils Relative: 5.3 % — ABNORMAL HIGH (ref 0.0–5.0)
HCT: 36.1 % (ref 36.0–46.0)
Hemoglobin: 12.7 g/dL (ref 12.0–15.0)
Lymphocytes Relative: 23.5 % (ref 12.0–46.0)
Lymphs Abs: 0.9 10*3/uL (ref 0.7–4.0)
MCHC: 35.1 g/dL (ref 30.0–36.0)
MCV: 87 fl (ref 78.0–100.0)
Monocytes Absolute: 0.2 10*3/uL (ref 0.1–1.0)
Monocytes Relative: 6 % (ref 3.0–12.0)
Neutro Abs: 2.4 10*3/uL (ref 1.4–7.7)
Neutrophils Relative %: 64.5 % (ref 43.0–77.0)
Platelets: 186 10*3/uL (ref 150.0–400.0)
RBC: 4.15 Mil/uL (ref 3.87–5.11)
RDW: 13.3 % (ref 11.5–14.6)
WBC: 3.7 10*3/uL — ABNORMAL LOW (ref 4.5–10.5)

## 2013-09-08 LAB — HEPATIC FUNCTION PANEL
ALT: 17 U/L (ref 0–35)
AST: 20 U/L (ref 0–37)
Albumin: 4.3 g/dL (ref 3.5–5.2)
Alkaline Phosphatase: 55 U/L (ref 39–117)
Bilirubin, Direct: 0 mg/dL (ref 0.0–0.3)
Total Bilirubin: 0.6 mg/dL (ref 0.3–1.2)
Total Protein: 7 g/dL (ref 6.0–8.3)

## 2013-09-08 LAB — LIPID PANEL
Cholesterol: 208 mg/dL — ABNORMAL HIGH (ref 0–200)
HDL: 88.3 mg/dL (ref >39.00)
Total CHOL/HDL Ratio: 2
Triglycerides: 58 mg/dL (ref 0.0–149.0)
VLDL: 11.6 mg/dL (ref 0.0–40.0)

## 2013-09-08 LAB — LDL CHOLESTEROL, DIRECT: Direct LDL: 108.5 mg/dL

## 2013-09-09 ENCOUNTER — Encounter: Payer: Self-pay | Admitting: General Practice

## 2013-09-09 ENCOUNTER — Other Ambulatory Visit: Payer: Self-pay | Admitting: General Practice

## 2013-09-09 MED ORDER — LEVOTHYROXINE SODIUM 112 MCG PO TABS: 112.0000 ug | ORAL_TABLET | Freq: Every day | ORAL | Status: DC

## 2013-09-12 LAB — VITAMIN D 1,25 DIHYDROXY: Vitamin D3 1, 25 (OH)2: 101 pg/mL

## 2013-09-14 ENCOUNTER — Encounter: Payer: Self-pay | Admitting: General Practice

## 2013-09-25 ENCOUNTER — Other Ambulatory Visit: Payer: Self-pay | Admitting: Obstetrics & Gynecology

## 2013-10-12 ENCOUNTER — Encounter: Payer: Self-pay | Admitting: Family Medicine

## 2013-10-16 ENCOUNTER — Ambulatory Visit
Admission: RE | Admit: 2013-10-16 | Discharge: 2013-10-16 | Disposition: A | Discharge status: Home / Self Care | Payer: BC Managed Care – PPO | Source: Ambulatory Visit | Attending: Family Medicine | Admitting: Family Medicine

## 2013-10-16 DIAGNOSIS — Z1231 Encounter for screening mammogram for malignant neoplasm of breast: Secondary | ICD-10-CM

## 2013-10-16 DIAGNOSIS — M899 Disorder of bone, unspecified: Secondary | ICD-10-CM

## 2013-10-19 ENCOUNTER — Encounter: Payer: Self-pay | Admitting: Family Medicine

## 2013-10-19 DIAGNOSIS — M810 Age-related osteoporosis without current pathological fracture: Secondary | ICD-10-CM

## 2013-10-20 ENCOUNTER — Other Ambulatory Visit: Payer: Self-pay | Admitting: Family Medicine

## 2013-10-20 DIAGNOSIS — R928 Other abnormal and inconclusive findings on diagnostic imaging of breast: Secondary | ICD-10-CM

## 2013-10-21 NOTE — Telephone Encounter (Signed)
Referral placed.

## 2013-11-02 NOTE — Telephone Encounter (Signed)
Endo calls the patient directly, but they must be behind on their incoming referrals. I sent a message to the patient offering to make her appointment for her.

## 2013-11-10 ENCOUNTER — Encounter: Payer: Self-pay | Admitting: Internal Medicine

## 2013-11-10 ENCOUNTER — Ambulatory Visit (INDEPENDENT_AMBULATORY_CARE_PROVIDER_SITE_OTHER): Payer: BC Managed Care – PPO | Admitting: Internal Medicine

## 2013-11-10 VITALS — BP 122/80 | HR 71 | Temp 97.7°F | Resp 10 | Ht 63.5 in | Wt 143.7 lb

## 2013-11-10 DIAGNOSIS — M810 Age-related osteoporosis without current pathological fracture: Secondary | ICD-10-CM

## 2013-11-10 LAB — BASIC METABOLIC PANEL
BUN: 15 mg/dL (ref 6–23)
Calcium: 9.9 mg/dL (ref 8.4–10.5)
Creatinine, Ser: 0.7 mg/dL (ref 0.4–1.2)
GFR: 97.25 mL/min (ref >60.00)
Glucose, Bld: 104 mg/dL — ABNORMAL HIGH (ref 70–99)
Potassium: 3.9 mEq/L (ref 3.5–5.1)

## 2013-11-10 LAB — PHOSPHORUS: Phosphorus: 3.9 mg/dL (ref 2.3–4.6)

## 2013-11-10 LAB — MAGNESIUM: Magnesium: 2.1 mg/dL (ref 1.5–2.5)

## 2013-11-10 NOTE — Progress Notes (Signed)
Patient ID: Linda Gay, female   DOB: Jul 19, 1956, 57 y.o.   MRN: [id number]   HPI  Linda Gay is a 57 y.o.-year-old female, referred by her PCP, Dr. Beverely Low, for management of osteoporosis.  Pt was dx with OP in 10/2013. She was previously also penes, starting in 2006. She denies any fractures or falls. No dizziness/vertigo/orthostasis.  I reviewed pt's DEXA scans (the only images I had were for the 2012 study, for the rest I reviewed the reports only): Date L1-L4 T score FN T score  10/16/2013 -0.8 LFN: -2.7  07/06/2011 -0.7* LFN: -2.3  04/21/2009 -0.1 LFN: -2.2  01/02/2005 -0.2 LFN: -2.2   She tried Fosamax for 12-18 mo >> started to have bone/joint pain >> had to stop the treatment.  No h/o hyper/hypocalcemia. No h/o hyperparathyroidism. No h/o kidney stones.  Lab Results  Component Value Date   CALCIUM 9.3 09/08/2013   CALCIUM 9.6 03/23/2013   CALCIUM 9.6 08/31/2010   No h/o thyrotoxicosis. Reviewed TSH recent levels:  Lab Results  Component Value Date   TSH 4.91 09/08/2013   TSH 2.436 03/23/2013   TSH 2.743 06/20/2012   TSH 3.611 01/21/2012   TSH 0.69 08/31/2010   No h/o vitamin D deficiency. She had a calcitriol level that was elevated, at 101 (18-72) on 09/08/2013. No recent 25-hydroxy vitamin D check.  Recent APhos was normal.  No h/o CKD. Last BUN/Cr: Lab Results  Component Value Date   BUN 12 09/08/2013   CREATININE 0.7 09/08/2013   Pt is not on calcium and vitamin D supplements. She also eats skim milk, cheese, yoghurt, and some green, leafy, vegetables, but not much. No weight bearing exercises. She does not take high vitamin A doses.  Pt does not have a FH of osteoporosis. No FH of kyphosis. No family history of calcium disorders.  I reviewed her chart and she also has a history of hypothyroidism. Menopause was at 28-51 y/o.   ROS: Constitutional: no weight gain/loss, no fatigue, no subjective hyperthermia/hypothermia Eyes: no blurry vision, no  xerophthalmia ENT: no sore throat, no nodules palpated in throat, no dysphagia/odynophagia, no hoarseness Cardiovascular: no CP/SOB/palpitations/leg swelling Respiratory: no cough/SOB Gastrointestinal: no N/V/D/C Musculoskeletal: no muscle/joint aches Skin: no rashes Neurological: no tremors/numbness/tingling/dizziness Psychiatric: no depression/anxiety  Past Medical History  Diagnosis Date  . Thyroid disease    History reviewed. No pertinent past surgical history. History   Social History  . Marital Status: Married    Spouse Name: N/A    Number of Children: 0   Occupational History  . Admin   Social History Main Topics  . Smoking status: Never Smoker   . Smokeless tobacco: Never Used  . Alcohol Use: Yes - one drink daily  . Drug Use: No  . 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   Regular exercise: no   Caffeine use: daily   Current Outpatient Prescriptions on File Prior to Visit  Medication Sig Dispense Refill  . fluticasone (FLONASE) 50 MCG/ACT nasal spray as needed. Use 2 sprays each nostril twice daily for 3 days, then just once daily      . levothyroxine (SYNTHROID, LEVOTHROID) 112 MCG tablet Take 1 tablet (112 mcg total) by mouth daily.  90 tablet  3   No current facility-administered medications on file prior to visit.   No Known Allergies Family History  Problem Relation Age of Onset  .  Hypertension Mother   . Cancer Father     prostate  . Hypertension Brother   . Stroke Maternal Grandmother   . Heart disease Maternal Grandmother   . Cancer Maternal Grandfather     lung  . Diabetes Paternal Grandmother   . Cancer Paternal Grandfather     prostate   PE: BP 122/80  Pulse 71  Temp(Src) 97.7 F (36.5 C) (Oral)  Resp 10  Ht 5' 3.5" (1.613 m)  Wt 143 lb 11.2 oz (65.182 kg)  BMI 25.05 kg/m2 Wt Readings from Last 3 Encounters:  11/10/13 143 lb 11.2 oz (65.182 kg)  09/04/13 143 lb 6  oz (65.034 kg)  03/23/13 142 lb (64.411 kg)   Constitutional: overweight, in NAD. mild kyphosis. Eyes: PERRLA, EOMI, no exophthalmos ENT: moist mucous membranes, no thyromegaly, no cervical lymphadenopathy Cardiovascular: RRR, No MRG Respiratory: CTA B Gastrointestinal: abdomen soft, NT, ND, BS+ Musculoskeletal: no deformities, strength intact in all 4 Skin: moist, warm, no rashes Neurological: no tremor with outstretched hands, DTR normal in all 4  Assessment: 1. Osteoporosis  Plan: 1. 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  - We discussed about the different medication classes, benefits and side effects (including atypical fractures and ONJ - she does have some dental work glands, and will have an appointment with her dentist in 3 days). I explained that, since she had previous side effects (Bone and muscle pain) to oral bisphosphonates, I would not use either po or IV bisphosphonate (zoledronic acid = Reclast) for her. I don't think we need Teriparatide at the moment. I agree with PCP that Prolia is probably the best for her. Pt was given reading information about Prolia, and I explained the mechanism of action and expected benefits.  - patient asked me whether I believe that her hypothyroidism might influence the osteoporosis, and I reassured the patient that only thyrotoxicosis can worsen BMD. I reviewed her thyroid tests for the last for years and she appears well replaced on levothyroxine - we reviewed her dietary and supplemental calcium and vitamin D intake, which I am not sure are adequate. I advised her to continue this but calculate the amount of calcium and vit D that she gets in a day and supplement if not getting  enough - given her specific instructions about food sources for these - see pt instructions  - discussed fall precautions  - given handout from [facility] Re: weight bearing exercises - advised to do this every day or at least 5/7 days, at least 30 minutes a day - we discussed about different possible directions for management. We discussed about maintaining a good amount of protein in her diet. The recommended daily protein intake is ~0.8 g per kilogram per day. I advised her to try to aim for this amount, since a diet low in proteins can exacerbate osteoporosis. Also, she is not smoking and not using >2 drinks of alcohol a day. - We will check the following tests:  Vitamin D  Phosphorus  magnesium  iPTH + Ca - if all normal, will arrange for Prolia, but we will await her decision. - we discussed that another option is to check a new DEXA  scan in a year, off that she institute healthy exercise changes and improves her dietary calcium and vitamin D intake, and, if decreases further, I would strongly encourage treatment, likely in the form of denosumab  - will see pt back in a year  Office Visit on 11/10/2013  Component Date Value Range Status  . Phosphorus 11/10/2013 3.9  2.3 - 4.6 mg/dL Final  . Magnesium 09/81/1914 2.1  1.5 - 2.5 mg/dL Final  . Vit D, [id number] 11/10/2013 31  30 - 89 ng/mL Final   Comment: This assay accurately quantifies Vitamin D, which is the sum of the                          25-Hydroxy forms of Vitamin D2 and D3.  Studies have shown that the                          optimum concentration of 25-Hydroxy Vitamin D is 30 ng/mL or higher.                           Concentrations of Vitamin D between 20 and 29 ng/mL are considered to                          be insufficient and concentrations less than 20 ng/mL are considered                          to be deficient for Vitamin D.  . PTH 11/10/2013 TEST NOT PERFORMED  14.0 - 72.0 pg/mL Final    Test request received without appropriate specimen.  . Sodium 11/10/2013 139  135 - 145 mEq/L Final  . Potassium 11/10/2013 3.9  3.5 - 5.1 mEq/L Final  . Chloride 11/10/2013 103  96 - 112 mEq/L Final  . CO2 11/10/2013 26  19 - 32 mEq/L Final  . Glucose, Bld 11/10/2013 104* 70 - 99 mg/dL Final  . BUN 08/65/7846 15  6 - 23 mg/dL Final  . Creatinine, Ser 11/10/2013 0.7  0.4 - 1.2 mg/dL Final  . Calcium 96/29/5284 9.9  8.4 - 10.5 mg/dL Final  . GFR 13/24/4010 97.25  >60.00 mL/min Final   Msg sent: Dear Ms Albertine Grates, Your results are all normal, except for the PTH, which was not drawn, due to an error during collection. I think we can check this when you come back next time. Please let me know if you decided for or against Prolia, so we can start the paperwork. Sincerely, Carlus Pavlov MD  We will await her decision.

## 2013-11-10 NOTE — Patient Instructions (Signed)
Please return in 1 year. Please check with your dentist if you still have dental work to do. Please let me know if you like to go ahead with Prolia, in this case, we will need to start the preauthorization process with your insurance.  Dietary sources of calcium and vitamin D:  Calcium content (mg) - http://www.niams.https://www.gonzalez.org/  Fortified oatmeal, 1 packet 350  Sardines, canned in oil, with edible bones, 3 oz. 324  Cheddar cheese, 1 oz. shredded 306  Milk, nonfat, 1 cup 302  Milkshake, 1 cup 300  Yogurt, plain, low-fat, 1 cup 300  Soybeans, cooked, 1 cup 261  Tofu, firm, with calcium,  cup 204  Orange juice, fortified with calcium, 6 oz. 200-260 (varies)  Salmon, canned, with edible bones, 3 oz. 181  Pudding, instant, made with 2% milk,  cup 153  Baked beans, 1 cup 142  Cottage cheese, 1% milk fat, 1 cup 138  Spaghetti, lasagna, 1 cup 125  Frozen yogurt, vanilla, soft-serve,  cup 103  Ready-to-eat cereal, fortified with calcium, 1 cup 100-1,000 (varies)  Cheese pizza, 1 slice 100  Fortified waffles, 2 100  Turnip greens, boiled,  cup 99  Broccoli, raw, 1 cup 90  Ice cream, vanilla,  cup 85  Soy or rice milk, fortified with calcium, 1 cup 80-500 (varies)   Vitamin D content (International Units, IU) - https://www.ars.usda.gov Cod liver oil, 1 tablespoon 1,360  Swordfish, cooked, 3 oz 566  Salmon (sockeye), cooked, 3 oz 447  Tuna fish, canned in water, drained, 3 oz 154  Orange juice fortified with vitamin D, 1 cup (check product labels, as amount of added vitamin D varies) 137  Milk, nonfat, reduced fat, and whole, vitamin D-fortified, 1 cup 115-124  Yogurt, fortified with 20% of the daily value for vitamin D, 6 oz 80  Margarine, fortified, 1 tablespoon 60  Sardines, canned in oil, drained, 2 sardines 46  Liver, beef, cooked, 3 oz 42  Egg, 1 large (vitamin D is found in yolk) 41  Ready-to-eat cereal, fortified with 10% of the daily value for  vitamin D, 0.75-1 cup  40  Cheese, Swiss, 1 oz 6   Exercise for Strong Bones (from [employer]) There are two types of exercises that are important for building and maintaining bone density:  weight-bearing and muscle-strengthening exercises. Weight-bearing Exercises These exercises include activities that make you move against gravity while staying upright. Weight-bearing exercises can be high-impact or low-impact. High-impact weight-bearing exercises help build bones and keep them strong. If you have broken a bone due to osteoporosis or are at risk of breaking a bone, you may need to avoid high-impact exercises. If youre not sure, you should check with your healthcare provider. Examples of high-impact weight-bearing exercises are:   Dancing   Doing high-impact aerobics   Hiking   Jogging/running   Jumping Rope   Stair climbing   Tennis Low-impact weight-bearing exercises can also help keep bones strong and are a safe alternative if you cannot do high-impact exercises. Examples of low-impact weight-bearing exercises are:   Using elliptical training machines   Doing low-impact aerobics   Using stair-step machines   Fast walking on a treadmill or outside Muscle-Strengthening Exercises These exercises include activities where you move your body, a weight or some other resistance against gravity. They are also known as resistance exercises and include:   Lifting weights   Using elastic exercise bands   Using weight machines   Lifting your own body weight   Functional  movements, such as standing and rising up on your toes Yoga and Pilates can also improve strength, balance and flexibility. However, certain positions may not be safe for people with osteoporosis or those at increased risk of broken bones. For example, exercises that have you bend forward may increase the chance of breaking a bone in the spine. A physical therapist should be able to help you learn which  exercises are safe and appropriate for you. Non-Impact Exercises Non-impact exercises can help you to improve balance, posture and how well you move in everyday activities. These exercises can also help to increase muscle strength and decrease the risk of falls and broken bones. Some of these exercises include:   Balance exercises that strengthen your legs and test your balance, such as Tai Chi, can decrease your risk of falls.   Posture exercises that improve your posture and reduce rounded or sloping shoulders can help you decrease the chance of breaking a bone, especially in the spine.   Functional exercises that improve how well you move can help you with everyday activities and decrease your chance of falling and breaking a bone. For example, if you have trouble getting up from a chair or climbing stairs, you should do these activities as exercises. A physical therapist can teach you balance, posture and functional exercises. Starting a New Exercise Program If you havent exercised regularly for a while, check with your healthcare provider before beginning a new exercise program--particularly if you have health problems such as heart disease, diabetes or high blood pressure. If youre at high risk of breaking a bone, you should work with a physical therapist to develop a safe exercise program. Once you have your healthcare providers approval, start slowly. If youve already broken bones in the spine because of osteoporosis, be very careful to avoid activities that require reaching down, bending forward, rapid twisting motions, heavy lifting and those that increase your chance of a fall. As you get started, your muscles may feel sore for a day or two after you exercise. If soreness lasts longer, you may be working too hard and need to ease up. Exercises should be done in a pain-free range of motion. How Much Exercise Do You Need? Weight-bearing exercises 30 minutes on most days of the week. Do a  30-minutesession or multiple sessions spread out throughout the day. The benefits to your bones are the same.   Muscle-strengthening exercises Two to three days per week. If you dont have much time for strengthening/resistance training, do small amounts at a time. You can do just one body part each day. For example do arms one day, legs the next and trunk the next. You can also spread these exercises out during your normal day.  Balance, posture and functional exercises Every day or as often as needed. You may want to focus on one area more than the others. If you have fallen or lose your balance, spend time doing balance exercises. If you are getting rounded shoulders, work more on posture exercises. If you have trouble climbing stairs or getting up from the couch, do more functional exercises. You can also perform these exercises at one time or spread them during your day. Work with a phyiscal therapist to learn the right exercises for you.    Denosumab: Patient drug information (Up-to-date) Copyright (418)[phone] [employer] All rights reserved.  Brand Names: [place]  ProliaRivka Gay What is this drug used for?  It is used to treat soft, brittle bones (osteoporosis).  It is used for bone growth.  It is used when treating some cancers.  It may be given to you for other reasons. Talk with the doctor. What do I need to tell my doctor BEFORE I take this drug?  All products:  If you have an allergy to denosumab or any other part of this drug.  If you are allergic to any drugs like this one, any other drugs, foods, or other substances. Tell your doctor about the allergy and what signs you had, like rash; hives; itching; shortness of breath; wheezing; cough; swelling of face, lips, tongue, or throat; or any other signs.  If you have low calcium levels.  Prolia:  If you are pregnant or may be pregnant. Do not take this drug if you are pregnant.  This is not a list of all drugs or health  problems that interact with this drug.  Tell your doctor and pharmacist about all of your drugs (prescription or OTC, natural products, vitamins) and health problems. You must check to make sure that it is safe for you to take this drug with all of your drugs and health problems. Do not start, stop, or change the dose of any drug without checking with your doctor. What are some things I need to know or do while I take this drug?  All products:  Tell dentists, surgeons, and other doctors that you use this drug.  This drug may raise the chance of a broken leg. Talk with your doctor.  Have your blood work checked. Talk with your doctor.  Have a bone density test. Talk with your doctor.  Take calcium and vitamin D as you were told by your doctor.  Have a dental exam before starting this drug.  Take good care of your teeth. See a dentist often.  If you smoke, talk with your doctor.  Do not give to a child. Talk with your doctor.  Tell your doctor if you are breast-feeding. You will need to talk about any risks to your baby.  Linda Gay:  This drug may cause harm to the unborn baby if you take it while you are pregnant. If you get pregnant while taking this drug, call your doctor right away.  Prolia:  Very bad infections have been reported with use of this drug. If you have any infection, are taking antibiotics now or in the recent past, or have many infections, talk with your doctor.  You may have more chance of getting an infection. Wash hands often. Stay away from people with infections, colds, or flu.  Use birth control that you can trust to prevent pregnancy while taking this drug.  If you are a man and your sex partner is pregnant or gets pregnant at any time while you are being treated, talk with your doctor. What are some side effects that I need to call my doctor about right away?  WARNING/CAUTION: Even though it may be rare, some people may have very bad and sometimes deadly  side effects when taking a drug. Tell your doctor or get medical help right away if you have any of the following signs or symptoms that may be related to a very bad side effect:  All products:  Signs of an allergic reaction, like rash; hives; itching; red, swollen, blistered, or peeling skin with or without fever; wheezing; tightness in the chest or throat; trouble breathing or talking; unusual hoarseness; or swelling of the mouth, face, lips, tongue, or throat.  Signs of low  calcium levels like muscle cramps or spasms, numbness and tingling, or seizures.  Mouth sores.  Any new or strange groin, hip, or thigh pain.  This drug may cause jawbone problems. The chance may be higher the longer you take this drug. The chance may be higher if you have cancer, dental problems, dentures that do not fit well, anemia, blood clotting problems, or an infection. The chance may also be higher if you are having dental work or if you are getting chemo, some steroid drugs, or radiation. Call your doctor right away if you have jaw swelling or pain.  Xgeva:  Not hungry.  Muscle pain or weakness.  Seizures.  Shortness of breath.  Prolia:  Signs of infection. These include a fever of 100.74F (38C) or higher, chills, very bad sore throat, ear or sinus pain, cough, more sputum or change in color of sputum, pain with passing urine, mouth sores, wound that will not heal, or anal itching or pain.  Signs of a pancreas problem (pancreatitis) like very bad stomach pain, very bad back pain, or very bad upset stomach or throwing up.  Chest pain.  A heartbeat that does not feel normal.  Very bad skin irritation.  Feeling very tired or weak.  Bladder pain or pain when passing urine or change in how much urine is passed.  Passing urine often.  Swelling in the arms or legs. What are some other side effects of this drug?  All drugs may cause side effects. However, many people have no side effects or only  have minor side effects. Call your doctor or get medical help if any of these side effects or any other side effects bother you or do not go away:  Xgeva:  Feeling tired or weak.  Headache.  Upset stomach or throwing up.  Loose stools (diarrhea).  Cough.  Prolia:  Back pain.  Muscle or joint pain.  Sore throat.  Runny nose.  Pain in arms or legs.  These are not all of the side effects that may occur. If you have questions about side effects, call your doctor. Call your doctor for medical advice about side effects.  You may report side effects to your national health agency. How is this drug best taken?  Use this drug as ordered by your doctor. Read and follow the dosing on the label closely.  It is given as a shot into the fatty part of the skin. What do I do if I miss a dose?  Call the doctor to find out what to do. How do I store and/or throw out this drug?  This drug will be given to you in a hospital or doctor's office. You will not store it at home.  Keep all drugs out of the reach of children and pets.  Check with your pharmacist about how to throw out unused drugs.  General drug facts  If your symptoms or health problems do not get better or if they become worse, call your doctor.  Do not share your drugs with others and do not take anyone else's drugs.  Keep a list of all your drugs (prescription, natural products, vitamins, OTC) with you. Give this list to your doctor.  Talk with the doctor before starting any new drug, including prescription or OTC, natural products, or vitamins.  Some drugs may have another patient information leaflet. If you have any questions about this drug, please talk with your doctor, pharmacist, or other health care provider.  If you think  there has been an overdose, call your poison control center or get medical care right away. Be ready to tell or show what was taken, how much, and when it happened.

## 2013-11-11 LAB — VITAMIN D 25 HYDROXY (VIT D DEFICIENCY, FRACTURES): Vit D, 25-Hydroxy: 31 ng/mL (ref 30–89)

## 2013-11-12 ENCOUNTER — Telehealth: Payer: Self-pay | Admitting: *Deleted

## 2013-11-12 NOTE — Telephone Encounter (Signed)
[facility] lab called to say this patients PTHI test needed a frozen specimen and one did not come with it, so they were unable to do the test.

## 2013-11-13 ENCOUNTER — Ambulatory Visit
Admission: RE | Admit: 2013-11-13 | Discharge: 2013-11-13 | Disposition: A | Discharge status: Home / Self Care | Payer: BC Managed Care – PPO | Source: Ambulatory Visit | Attending: Family Medicine | Admitting: Family Medicine

## 2013-11-13 DIAGNOSIS — R928 Other abnormal and inconclusive findings on diagnostic imaging of breast: Secondary | ICD-10-CM

## 2013-11-13 NOTE — Telephone Encounter (Signed)
Bjorn Loser, Can you please let her know about this >> need to come back for repeat iPTH + calcium. Also, [facility] needs to know as we would be checking iPTH on many people.Marland KitchenMarland Kitchen

## 2013-11-19 ENCOUNTER — Institutional Professional Consult (permissible substitution): Payer: BC Managed Care – PPO | Admitting: Internal Medicine

## 2014-02-08 ENCOUNTER — Ambulatory Visit (INDEPENDENT_AMBULATORY_CARE_PROVIDER_SITE_OTHER): Payer: BC Managed Care – PPO | Admitting: Physician Assistant

## 2014-02-08 ENCOUNTER — Encounter: Payer: Self-pay | Admitting: Physician Assistant

## 2014-02-08 VITALS — BP 129/70 | HR 77 | Temp 99.2°F | Resp 14 | Ht 63.5 in | Wt 132.5 lb

## 2014-02-08 DIAGNOSIS — R3914 Feeling of incomplete bladder emptying: Secondary | ICD-10-CM

## 2014-02-08 DIAGNOSIS — N390 Urinary tract infection, site not specified: Secondary | ICD-10-CM | POA: Insufficient documentation

## 2014-02-08 DIAGNOSIS — R300 Dysuria: Secondary | ICD-10-CM

## 2014-02-08 DIAGNOSIS — R339 Retention of urine, unspecified: Secondary | ICD-10-CM

## 2014-02-08 DIAGNOSIS — R350 Frequency of micturition: Secondary | ICD-10-CM

## 2014-02-08 LAB — POCT URINALYSIS DIPSTICK
Bilirubin, UA: NEGATIVE
GLUCOSE UA: NEGATIVE
KETONES UA: NEGATIVE
Nitrite, UA: NEGATIVE
Protein, UA: NEGATIVE
SPEC GRAV UA: 1.015
Urobilinogen, UA: 0.2
pH, UA: 7.5

## 2014-02-08 MED ORDER — CIPROFLOXACIN HCL 500 MG PO TABS: 500.0000 mg | ORAL_TABLET | Freq: Two times a day (BID) | ORAL | Status: DC

## 2014-02-08 NOTE — Assessment & Plan Note (Signed)
Moderate blood and leukocytes.  Will send urine for culture.  Will empirically treat with Cipro BID x 3 days.  Increase fluids.  Take a probiotic and cranberry supplement.  Return if symptoms not improving.  Will alter antibiotic therapy if indicated by sensitivities.

## 2014-02-08 NOTE — Addendum Note (Signed)
Addended by: Rockwell Germany on: 02/08/2014 01:50 PM   Modules accepted: Orders

## 2014-02-08 NOTE — Patient Instructions (Signed)
Please take antibiotic as directed. Increase fluid intake. Take a cranberry tablet and a probiotic (Align, Culturelle, Activia) I will call you with the results of your urine culture If symptoms are not improving or acutely worsen, please return to clinic.  Urinary Tract Infection Urinary tract infections (UTIs) can develop anywhere along your urinary tract. Your urinary tract is your body's drainage system for removing wastes and extra water. Your urinary tract includes two kidneys, two ureters, a bladder, and a urethra. Your kidneys are a pair of bean-shaped organs. Each kidney is about the size of your fist. They are located below your ribs, one on each side of your spine. CAUSES Infections are caused by microbes, which are microscopic organisms, including fungi, viruses, and bacteria. These organisms are so small that they can only be seen through a microscope. Bacteria are the microbes that most commonly cause UTIs. SYMPTOMS  Symptoms of UTIs may vary by age and gender of the patient and by the location of the infection. Symptoms in young women typically include a frequent and intense urge to urinate and a painful, burning feeling in the bladder or urethra during urination. Older women and men are more likely to be tired, shaky, and weak and have muscle aches and abdominal pain. A fever may mean the infection is in your kidneys. Other symptoms of a kidney infection include pain in your back or sides below the ribs, nausea, and vomiting. DIAGNOSIS To diagnose a UTI, your caregiver will ask you about your symptoms. Your caregiver also will ask to provide a urine sample. The urine sample will be tested for bacteria and white blood cells. White blood cells are made by your body to help fight infection. TREATMENT  Typically, UTIs can be treated with medication. Because most UTIs are caused by a bacterial infection, they usually can be treated with the use of antibiotics. The choice of antibiotic and  length of treatment depend on your symptoms and the type of bacteria causing your infection. HOME CARE INSTRUCTIONS  If you were prescribed antibiotics, take them exactly as your caregiver instructs you. Finish the medication even if you feel better after you have only taken some of the medication.  Drink enough water and fluids to keep your urine clear or pale yellow.  Avoid caffeine, tea, and carbonated beverages. They tend to irritate your bladder.  Empty your bladder often. Avoid holding urine for long periods of time.  Empty your bladder before and after sexual intercourse.  After a bowel movement, women should cleanse from front to back. Use each tissue only once. SEEK MEDICAL CARE IF:   You have back pain.  You develop a fever.  Your symptoms do not begin to resolve within 3 days. SEEK IMMEDIATE MEDICAL CARE IF:   You have severe back pain or lower abdominal pain.  You develop chills.  You have nausea or vomiting.  You have continued burning or discomfort with urination. MAKE SURE YOU:   Understand these instructions.  Will watch your condition.  Will get help right away if you are not doing well or get worse. Document Released: 08/29/2005 Document Revised: 05/20/2012 Document Reviewed: 12/28/2011 [facility] Patient Information 2014 [place].

## 2014-02-08 NOTE — Progress Notes (Signed)
Pre visit review using our clinic review tool, if applicable. No additional management support is needed unless otherwise documented below in the visit note/SLS  

## 2014-02-08 NOTE — Progress Notes (Signed)
Patient presents to clinic today c/o urinary frequency, urgency, decreased stream and dysuria x 1 week.  Patient endorses some mild nausea.  Patient denies hematuria, emesis, flank pain, fever or chills. Temp is 99.2 in clinic today.  Past Medical History  Diagnosis Date  . Thyroid disease    Current Outpatient Prescriptions on File Prior to Visit  Medication Sig Dispense Refill  . fluticasone (FLONASE) 50 MCG/ACT nasal spray as needed. Use 2 sprays each nostril twice daily for 3 days, then just once daily      . levothyroxine (SYNTHROID, LEVOTHROID) 112 MCG tablet Take 1 tablet (112 mcg total) by mouth daily.  90 tablet  3   No current facility-administered medications on file prior to visit.    No Known Allergies  Family History  Problem Relation Age of Onset  . Hypertension Mother   . Cancer Father     prostate  . Hypertension Brother   . Stroke Maternal Grandmother   . Heart disease Maternal Grandmother   . Cancer Maternal Grandfather     lung  . Diabetes Paternal Grandmother   . Cancer Paternal Grandfather     prostate    History   Social History  . Marital Status: Married    Spouse Name: N/A    Number of Children: N/A  . Years of Education: N/A   Social History Main Topics  . Smoking status: Never Smoker   . Smokeless tobacco: Never Used  . Alcohol Use: Yes  . Drug Use: No  . Sexual Activity: Yes    Partners: Male   Other Topics Concern  . 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   Regular exercise: no   Caffeine use: daily   Review of Systems - See HPI.  All other ROS are negative.  BP 129/70  Pulse 77  Temp(Src) 99.2 F (37.3 C) (Oral)  Resp 14  Ht 5' 3.5" (1.613 m)  Wt 132 lb 8 oz (60.102 kg)  BMI 23.10 kg/m2  SpO2 99%  Physical Exam  Constitutional: She is oriented to person, place, and time and well-developed, well-nourished, and in no distress.  HENT:  Head: Normocephalic and atraumatic.  Eyes: Conjunctivae  are normal. Pupils are equal, round, and reactive to light.  Neck: Neck supple.  Cardiovascular: Normal rate, regular rhythm, normal heart sounds and intact distal pulses.   Pulmonary/Chest: Effort normal and breath sounds normal. No respiratory distress. She has no wheezes. She has no rales. She exhibits no tenderness.  Negative CVA tenderness.  Neurological: She is alert and oriented to person, place, and time.  Skin: Skin is warm and dry. No rash noted.  Psychiatric: Affect normal.     Assessment/Plan: UTI (lower urinary tract infection) Moderate blood and leukocytes.  Will send urine for culture.  Will empirically treat with Cipro BID x 3 days.  Increase fluids.  Take a probiotic and cranberry supplement.  Return if symptoms not improving.  Will alter antibiotic therapy if indicated by sensitivities.

## 2014-02-11 LAB — CULTURE, URINE COMPREHENSIVE

## 2014-04-27 ENCOUNTER — Other Ambulatory Visit: Payer: Self-pay | Admitting: Family Medicine

## 2014-04-27 ENCOUNTER — Other Ambulatory Visit: Payer: Self-pay

## 2014-04-27 DIAGNOSIS — N63 Unspecified lump in unspecified breast: Secondary | ICD-10-CM

## 2014-05-17 ENCOUNTER — Ambulatory Visit
Admission: RE | Admit: 2014-05-17 | Discharge: 2014-05-17 | Disposition: A | Discharge status: Home / Self Care | Payer: BC Managed Care – PPO | Source: Ambulatory Visit | Attending: Family Medicine | Admitting: Family Medicine

## 2014-05-17 DIAGNOSIS — N63 Unspecified lump in unspecified breast: Secondary | ICD-10-CM

## 2014-08-31 ENCOUNTER — Other Ambulatory Visit: Payer: Self-pay | Admitting: Family Medicine

## 2014-08-31 NOTE — Telephone Encounter (Signed)
Last OV 09/2013 Med last filled 09/09/13 #90 with 3  Pt has a CPE scheduled for January.

## 2014-09-01 NOTE — Telephone Encounter (Signed)
Med filled.  

## 2014-09-07 ENCOUNTER — Ambulatory Visit: Payer: BC Managed Care – PPO | Admitting: Family Medicine

## 2014-11-04 ENCOUNTER — Other Ambulatory Visit: Payer: Self-pay | Admitting: Family Medicine

## 2014-11-04 DIAGNOSIS — N63 Unspecified lump in unspecified breast: Secondary | ICD-10-CM

## 2014-11-16 ENCOUNTER — Other Ambulatory Visit: Payer: Self-pay

## 2014-11-16 ENCOUNTER — Other Ambulatory Visit: Payer: Self-pay | Admitting: Family Medicine

## 2014-11-16 DIAGNOSIS — N63 Unspecified lump in unspecified breast: Secondary | ICD-10-CM

## 2014-11-19 ENCOUNTER — Ambulatory Visit
Admission: RE | Admit: 2014-11-19 | Discharge: 2014-11-19 | Disposition: A | Discharge status: Home / Self Care | Payer: BC Managed Care – PPO | Source: Ambulatory Visit | Attending: Family Medicine | Admitting: Family Medicine

## 2014-11-19 DIAGNOSIS — N63 Unspecified lump in unspecified breast: Secondary | ICD-10-CM

## 2014-11-23 ENCOUNTER — Telehealth: Payer: Self-pay | Admitting: Family Medicine

## 2014-11-23 MED ORDER — LEVOTHYROXINE SODIUM 112 MCG PO TABS: 112.0000 ug | ORAL_TABLET | Freq: Every day | ORAL | Status: DC

## 2014-11-23 NOTE — Telephone Encounter (Signed)
Caller name: Decarlo, Nicholette H Relation to pt: self  Call back number: [phone] Pharmacy:[facility] [phone]   Reason for call:  Pt is requesting a 1 month refill levothyroxine (SYNTHROID, LEVOTHROID) 112 MCG tablet to hold her over to next appointment

## 2014-11-23 NOTE — Telephone Encounter (Signed)
Med filled.  

## 2014-12-20 ENCOUNTER — Encounter: Payer: BC Managed Care – PPO | Admitting: Family Medicine

## 2014-12-22 ENCOUNTER — Ambulatory Visit (INDEPENDENT_AMBULATORY_CARE_PROVIDER_SITE_OTHER): Payer: BLUE CROSS/BLUE SHIELD | Admitting: Family Medicine

## 2014-12-22 ENCOUNTER — Encounter: Payer: Self-pay | Admitting: Family Medicine

## 2014-12-22 ENCOUNTER — Other Ambulatory Visit: Payer: Self-pay | Admitting: General Practice

## 2014-12-22 VITALS — BP 130/78 | HR 67 | Temp 98.1°F | Resp 16 | Ht 63.25 in | Wt 127.2 lb

## 2014-12-22 DIAGNOSIS — Z0000 Encounter for general adult medical examination without abnormal findings: Secondary | ICD-10-CM

## 2014-12-22 DIAGNOSIS — D229 Melanocytic nevi, unspecified: Secondary | ICD-10-CM

## 2014-12-22 DIAGNOSIS — M810 Age-related osteoporosis without current pathological fracture: Secondary | ICD-10-CM | POA: Diagnosis not present

## 2014-12-22 LAB — BASIC METABOLIC PANEL
BUN: 21 mg/dL (ref 6–23)
CHLORIDE: 105 meq/L (ref 96–112)
CO2: 30 meq/L (ref 19–32)
CREATININE: 0.72 mg/dL (ref 0.40–1.20)
Calcium: 9.6 mg/dL (ref 8.4–10.5)
GFR: 87.63 mL/min (ref >60.00)
Glucose, Bld: 122 mg/dL — ABNORMAL HIGH (ref 70–99)
Potassium: 3.4 mEq/L — ABNORMAL LOW (ref 3.5–5.1)
Sodium: 140 mEq/L (ref 135–145)

## 2014-12-22 LAB — CBC WITH DIFFERENTIAL/PLATELET
BASOS ABS: 0 10*3/uL (ref 0.0–0.1)
Basophils Relative: 0.5 % (ref 0.0–3.0)
EOS PCT: 3.1 % (ref 0.0–5.0)
Eosinophils Absolute: 0.1 10*3/uL (ref 0.0–0.7)
HEMATOCRIT: 37 % (ref 36.0–46.0)
Hemoglobin: 13 g/dL (ref 12.0–15.0)
Lymphocytes Relative: 24.8 % (ref 12.0–46.0)
Lymphs Abs: 1.1 10*3/uL (ref 0.7–4.0)
MCHC: 35.2 g/dL (ref 30.0–36.0)
MCV: 87 fl (ref 78.0–100.0)
Monocytes Absolute: 0.2 10*3/uL (ref 0.1–1.0)
Monocytes Relative: 4.2 % (ref 3.0–12.0)
Neutro Abs: 2.9 10*3/uL (ref 1.4–7.7)
Neutrophils Relative %: 67.4 % (ref 43.0–77.0)
PLATELETS: 193 10*3/uL (ref 150.0–400.0)
RBC: 4.25 Mil/uL (ref 3.87–5.11)
RDW: 12.9 % (ref 11.5–15.5)
WBC: 4.4 10*3/uL (ref 4.0–10.5)

## 2014-12-22 LAB — HEPATIC FUNCTION PANEL
ALT: 12 U/L (ref 0–35)
AST: 14 U/L (ref 0–37)
Albumin: 4.3 g/dL (ref 3.5–5.2)
Alkaline Phosphatase: 54 U/L (ref 39–117)
BILIRUBIN DIRECT: 0.1 mg/dL (ref 0.0–0.3)
BILIRUBIN TOTAL: 0.5 mg/dL (ref 0.2–1.2)
Total Protein: 6.7 g/dL (ref 6.0–8.3)

## 2014-12-22 LAB — LIPID PANEL
CHOL/HDL RATIO: 2
Cholesterol: 205 mg/dL — ABNORMAL HIGH (ref 0–200)
HDL: 86.2 mg/dL (ref >39.00)
LDL CALC: 105 mg/dL — AB (ref 0–99)
NONHDL: 118.8
Triglycerides: 70 mg/dL (ref 0.0–149.0)
VLDL: 14 mg/dL (ref 0.0–40.0)

## 2014-12-22 LAB — TSH: TSH: 1.16 u[IU]/mL (ref 0.35–4.50)

## 2014-12-22 LAB — VITAMIN D 25 HYDROXY (VIT D DEFICIENCY, FRACTURES): VITD: 16.54 ng/mL — ABNORMAL LOW (ref 30.00–100.00)

## 2014-12-22 MED ORDER — VITAMIN D (ERGOCALCIFEROL) 1.25 MG (50000 UNIT) PO CAPS: 50000.0000 [IU] | ORAL_CAPSULE | ORAL | Status: DC

## 2014-12-22 NOTE — Patient Instructions (Signed)
Follow up in 1 year or as needed We'll notify you of your lab results and make any changes if needed We'll call you with your dermatology appt Keep up the good work on healthy diet and regular exercise Try and take Ca and Vit D regularly Call with any questions or concerns Happy New Year!!!

## 2014-12-22 NOTE — Assessment & Plan Note (Signed)
Chronic problem.  Pt never followed through on Reclast.  Pt has never been on medication.  Not taking Ca and Vit D regularly.  Pt not interested in meds.  Pt is due for DEXA in November- order entered.

## 2014-12-22 NOTE — Progress Notes (Signed)
   Subjective:    Patient ID: Linda Gay, female    DOB: 03-Jul-1956, 59 y.o.   MRN: [id number]  HPI CPE- UTD on mammo, pap, DEXA, colonoscopy ([facility]- due next year).   Review of Systems Patient reports no vision/ hearing changes, adenopathy,fever, weight change,  persistant/recurrent hoarseness , swallowing issues, chest pain, palpitations, edema, persistant/recurrent cough, hemoptysis, dyspnea (rest/exertional/paroxysmal nocturnal), gastrointestinal bleeding (melena, rectal bleeding), abdominal pain, significant heartburn, bowel changes, GU symptoms (dysuria, hematuria, incontinence), Gyn symptoms (abnormal  bleeding, pain),  syncope, focal weakness, memory loss, numbness & tingling, skin/hair/nail changes, abnormal bruising or bleeding, anxiety, or depression.     Objective:   Physical Exam General Appearance:    Alert, cooperative, no distress, appears stated age  Head:    Normocephalic, without obvious abnormality, atraumatic  Eyes:    PERRL, conjunctiva/corneas clear, EOM's intact, fundi    benign, both eyes  Ears:    Normal TM's and external ear canals, both ears  Nose:   Nares normal, septum midline, mucosa normal, no drainage    or sinus tenderness  Throat:   Lips, mucosa, and tongue normal; teeth and gums normal  Neck:   Supple, symmetrical, trachea midline, no adenopathy;    Thyroid: no enlargement/tenderness/nodules  Back:     Symmetric, no curvature, ROM normal, no CVA tenderness  Lungs:     Clear to auscultation bilaterally, respirations unlabored  Chest Wall:    No tenderness or deformity   Heart:    Regular rate and rhythm, S1 and S2 normal, no murmur, rub   or gallop  Breast Exam:    Deferred to mammo  Abdomen:     Soft, non-tender, bowel sounds active all four quadrants,    no masses, no organomegaly  Genitalia:    Deferred to GYN  Rectal:    Extremities:   Extremities normal, atraumatic, no cyanosis or edema  Pulses:   2+ and symmetric all extremities    Skin:   Skin color, texture, turgor normal, no rashes or lesions  Lymph nodes:   Cervical, supraclavicular, and axillary nodes normal  Neurologic:   CNII-XII intact, normal strength, sensation and reflexes    throughout          Assessment & Plan:

## 2014-12-22 NOTE — Progress Notes (Signed)
Pre visit review using our clinic review tool, if applicable. No additional management support is needed unless otherwise documented below in the visit note. 

## 2014-12-22 NOTE — Assessment & Plan Note (Signed)
Pt's PE WNL.  UTD on pap, mammo, colonoscopy, DEXA.  Check labs.  Anticipatory guidance provided.  

## 2014-12-23 ENCOUNTER — Other Ambulatory Visit (INDEPENDENT_AMBULATORY_CARE_PROVIDER_SITE_OTHER): Payer: BLUE CROSS/BLUE SHIELD

## 2014-12-23 DIAGNOSIS — R7309 Other abnormal glucose: Secondary | ICD-10-CM

## 2014-12-23 DIAGNOSIS — R739 Hyperglycemia, unspecified: Secondary | ICD-10-CM

## 2014-12-23 LAB — HEMOGLOBIN A1C: Hgb A1c MFr Bld: 5.3 % (ref 4.6–6.5)

## 2014-12-27 ENCOUNTER — Other Ambulatory Visit: Payer: Self-pay | Admitting: Family Medicine

## 2014-12-27 MED ORDER — LEVOTHYROXINE SODIUM 112 MCG PO TABS: 112.0000 ug | ORAL_TABLET | Freq: Every day | ORAL | Status: DC

## 2014-12-27 NOTE — Telephone Encounter (Signed)
Med filled.  

## 2015-03-29 ENCOUNTER — Other Ambulatory Visit: Payer: Self-pay | Admitting: Family Medicine

## 2015-03-29 NOTE — Telephone Encounter (Signed)
Med filled.  

## 2015-09-22 ENCOUNTER — Other Ambulatory Visit: Payer: Self-pay | Admitting: Family Medicine

## 2015-09-22 NOTE — Telephone Encounter (Signed)
Medication filled to pharmacy as requested.   

## 2015-12-27 ENCOUNTER — Other Ambulatory Visit: Payer: Commercial Managed Care - HMO

## 2015-12-27 ENCOUNTER — Encounter: Payer: Self-pay | Admitting: Internal Medicine

## 2015-12-27 ENCOUNTER — Ambulatory Visit (INDEPENDENT_AMBULATORY_CARE_PROVIDER_SITE_OTHER): Payer: Commercial Managed Care - HMO | Admitting: Internal Medicine

## 2015-12-27 ENCOUNTER — Encounter: Payer: BLUE CROSS/BLUE SHIELD | Admitting: Family

## 2015-12-27 VITALS — BP 134/88 | HR 63 | Temp 98.3°F | Resp 16 | Wt 128.0 lb

## 2015-12-27 DIAGNOSIS — Z23 Encounter for immunization: Secondary | ICD-10-CM | POA: Diagnosis not present

## 2015-12-27 DIAGNOSIS — N390 Urinary tract infection, site not specified: Secondary | ICD-10-CM

## 2015-12-27 DIAGNOSIS — M810 Age-related osteoporosis without current pathological fracture: Secondary | ICD-10-CM | POA: Diagnosis not present

## 2015-12-27 DIAGNOSIS — E039 Hypothyroidism, unspecified: Secondary | ICD-10-CM

## 2015-12-27 DIAGNOSIS — R300 Dysuria: Secondary | ICD-10-CM

## 2015-12-27 LAB — POCT URINALYSIS DIPSTICK
Bilirubin, UA: NEGATIVE
Glucose, UA: NEGATIVE
KETONES UA: NEGATIVE
Nitrite, UA: NEGATIVE
PROTEIN UA: NEGATIVE
RBC UA: 10
SPEC GRAV UA: 1.02
UROBILINOGEN UA: 0.2
pH, UA: 6

## 2015-12-27 MED ORDER — CIPROFLOXACIN HCL 500 MG PO TABS: 500.0000 mg | ORAL_TABLET | Freq: Two times a day (BID) | ORAL | Status: DC

## 2015-12-27 NOTE — Progress Notes (Signed)
Subjective:    Patient ID: Linda Gay, female    DOB: [birth year], 60 y.o.   MRN: [id number]  HPI She is here to establish with a new pcp.    Hypothyroidism:  She is taking her medication daily.  She denies any recent changes in energy or weight that are unexplained.   ? UTI:  Her symptoms started about one week ago.  She has been taking AZO, but only took three tablets.  She has frequent urination and dysuria.  She denies hematuria, fever, nausea, abdominal pain and back pain.    Osteoporosis:  She is due for a dexa.  She is not exercising.  She is not taking calcium or vitamin D and stopped due to constipation.     Medications and allergies reviewed with patient and updated if appropriate.  Patient Active Problem List   Diagnosis Date Noted  . UTI (lower urinary tract infection) 02/08/2014  . Screening for malignant neoplasm of the cervix 09/04/2013  . Physical exam 09/04/2013  . Hypothyroid 01/21/2012  . Osteoporosis 10/05/2010  . HYPOTHYROIDISM 08/23/2010  . URI 08/23/2010    Current Outpatient Prescriptions on File Prior to Visit  Medication Sig Dispense Refill  . levothyroxine (SYNTHROID, LEVOTHROID) 112 MCG tablet TAKE 1 TABLET BY MOUTH DAILY 90 tablet 1   No current facility-administered medications on file prior to visit.    Past Medical History  Diagnosis Date  . Thyroid disease     No past surgical history on file.  Social History   Social History  . Marital Status: Married    Spouse Name: N/A  . Number of Children: N/A  . Years of Education: N/A   Social History Main Topics  . Smoking status: Never Smoker   . Smokeless tobacco: Never Used  . Alcohol Use: Yes  . Drug Use: No  . Sexual Activity:    Partners: Male   Other Topics Concern  . 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   Regular exercise: no   Caffeine use: daily    Family History  Problem Relation Age of Onset  . Hypertension Mother   . Cancer  Father     prostate  . Hypertension Brother   . Stroke Maternal Grandmother   . Heart disease Maternal Grandmother   . Cancer Maternal Grandfather     lung  . Diabetes Paternal Grandmother   . Cancer Paternal Grandfather     prostate    Review of Systems  Constitutional: Negative for fever and chills.  Respiratory: Negative for cough, shortness of breath and wheezing.   Cardiovascular: Negative for chest pain, palpitations and leg swelling.  Gastrointestinal: Negative for nausea and abdominal pain.  Genitourinary: Positive for dysuria and frequency. Negative for hematuria.  Musculoskeletal: Positive for back pain (chronic).  Neurological: Positive for light-headedness (if don't eat). Negative for dizziness and headaches.       Objective:   Filed Vitals:   12/27/15 1303  BP: 134/88  Pulse: 63  Temp: 98.3 F (36.8 C)  Resp: 16   Filed Weights   12/27/15 1303  Weight: 128 lb (58.06 kg)   Body mass index is 22.48 kg/(m^2).   Physical Exam  Constitutional: She appears well-developed and well-nourished. No distress.  HENT:  Head: Normocephalic and atraumatic.  Neck: Neck supple. No tracheal deviation present. No thyromegaly present.  No carotid bruit  Cardiovascular: Normal rate, regular rhythm and normal heart sounds.  No murmur heard. Pulmonary/Chest: Effort normal and breath sounds normal. No respiratory distress. She has no wheezes. She has no rales.  Abdominal: Soft. She exhibits no distension. There is no tenderness.  Genitourinary:  No cva tenderness  Musculoskeletal: She exhibits no edema.  Lymphadenopathy:    She has no cervical adenopathy.  Skin: Skin is warm and dry. She is not diaphoretic.       Assessment & Plan:   Dysuria, increased urinary frequency Urine dip UA, UCx Increase fluids Will treat with cipro if indicated  See Problem List for Assessment and Plan of chronic medical problems.  Follow up annually

## 2015-12-27 NOTE — Assessment & Plan Note (Signed)
dexa ordered Start regular exercise - ideally walking Start vitamin D 2000 units daily Has not tolerated calcium supplements - increase high calcium foods

## 2015-12-27 NOTE — Patient Instructions (Signed)
  We have reviewed your prior records including labs and tests today.  Test(s) ordered today. Your results will be released to [place] (or called to you) after review, usually within 72hours after test completion. If any changes need to be made, you will be notified at that same time.  All other Health Maintenance issues reviewed.   All recommended immunizations and age-appropriate screenings are up-to-date.  Flu vaccine administered today.   Medications reviewed and updated.  Start vitamin D 2000 units daily.    Start regular exercise.   A dexa scan referral was ordered.   Please followup annually, sooner if needed

## 2015-12-27 NOTE — Progress Notes (Signed)
Pre visit review using our clinic review tool, if applicable. No additional management support is needed unless otherwise documented below in the visit note. 

## 2015-12-27 NOTE — Assessment & Plan Note (Signed)
Check tsh Will adjust med if needed

## 2015-12-28 ENCOUNTER — Other Ambulatory Visit: Payer: Self-pay | Admitting: Internal Medicine

## 2015-12-28 ENCOUNTER — Other Ambulatory Visit (INDEPENDENT_AMBULATORY_CARE_PROVIDER_SITE_OTHER): Payer: Commercial Managed Care - HMO

## 2015-12-28 DIAGNOSIS — M810 Age-related osteoporosis without current pathological fracture: Secondary | ICD-10-CM

## 2015-12-28 DIAGNOSIS — E039 Hypothyroidism, unspecified: Secondary | ICD-10-CM

## 2015-12-28 DIAGNOSIS — N6320 Unspecified lump in the left breast, unspecified quadrant: Secondary | ICD-10-CM

## 2015-12-28 LAB — CBC WITH DIFFERENTIAL/PLATELET
BASOS PCT: 0.3 % (ref 0.0–3.0)
Basophils Absolute: 0 10*3/uL (ref 0.0–0.1)
EOS PCT: 3.3 % (ref 0.0–5.0)
Eosinophils Absolute: 0.1 10*3/uL (ref 0.0–0.7)
HCT: 39.8 % (ref 36.0–46.0)
Hemoglobin: 13.8 g/dL (ref 12.0–15.0)
LYMPHS ABS: 0.8 10*3/uL (ref 0.7–4.0)
Lymphocytes Relative: 20 % (ref 12.0–46.0)
MCHC: 34.6 g/dL (ref 30.0–36.0)
MCV: 87.7 fl (ref 78.0–100.0)
MONO ABS: 0.3 10*3/uL (ref 0.1–1.0)
MONOS PCT: 8.5 % (ref 3.0–12.0)
NEUTROS ABS: 2.7 10*3/uL (ref 1.4–7.7)
NEUTROS PCT: 67.9 % (ref 43.0–77.0)
Platelets: 192 10*3/uL (ref 150.0–400.0)
RBC: 4.54 Mil/uL (ref 3.87–5.11)
RDW: 13.1 % (ref 11.5–15.5)
WBC: 4 10*3/uL (ref 4.0–10.5)

## 2015-12-28 LAB — LIPID PANEL
CHOL/HDL RATIO: 3
CHOLESTEROL: 203 mg/dL — AB (ref 0–200)
HDL: 76.1 mg/dL (ref >39.00)
LDL CALC: 115 mg/dL — AB (ref 0–99)
NONHDL: 126.98
Triglycerides: 58 mg/dL (ref 0.0–149.0)
VLDL: 11.6 mg/dL (ref 0.0–40.0)

## 2015-12-28 LAB — COMPREHENSIVE METABOLIC PANEL
ALT: 11 U/L (ref 0–35)
AST: 13 U/L (ref 0–37)
Albumin: 4.5 g/dL (ref 3.5–5.2)
Alkaline Phosphatase: 53 U/L (ref 39–117)
BUN: 16 mg/dL (ref 6–23)
CHLORIDE: 103 meq/L (ref 96–112)
CO2: 29 meq/L (ref 19–32)
Calcium: 9.7 mg/dL (ref 8.4–10.5)
Creatinine, Ser: 0.82 mg/dL (ref 0.40–1.20)
GFR: 75.16 mL/min (ref >60.00)
GLUCOSE: 105 mg/dL — AB (ref 70–99)
POTASSIUM: 4.3 meq/L (ref 3.5–5.1)
SODIUM: 140 meq/L (ref 135–145)
TOTAL PROTEIN: 6.9 g/dL (ref 6.0–8.3)
Total Bilirubin: 0.8 mg/dL (ref 0.2–1.2)

## 2015-12-28 LAB — TSH: TSH: 2.22 u[IU]/mL (ref 0.35–4.50)

## 2015-12-28 LAB — HEMOGLOBIN A1C: HEMOGLOBIN A1C: 5 % (ref 4.6–6.5)

## 2015-12-30 LAB — URINE CULTURE: Colony Count: >=100000

## 2015-12-31 LAB — VITAMIN D 1,25 DIHYDROXY
Vitamin D 1, 25 (OH)2 Total: 54 pg/mL (ref 18–72)
Vitamin D2 1, 25 (OH)2: 13 pg/mL
Vitamin D3 1, 25 (OH)2: 41 pg/mL

## 2016-01-01 ENCOUNTER — Encounter: Payer: Self-pay | Admitting: Internal Medicine

## 2016-01-13 ENCOUNTER — Encounter: Payer: Self-pay | Admitting: Nurse Practitioner

## 2016-01-13 ENCOUNTER — Ambulatory Visit (INDEPENDENT_AMBULATORY_CARE_PROVIDER_SITE_OTHER): Payer: Commercial Managed Care - HMO | Admitting: Nurse Practitioner

## 2016-01-13 VITALS — BP 142/80 | HR 60 | Temp 98.6°F | Resp 16 | Wt 128.0 lb

## 2016-01-13 DIAGNOSIS — M25522 Pain in left elbow: Secondary | ICD-10-CM

## 2016-01-13 MED ORDER — INDOMETHACIN 50 MG PO CAPS: 50.0000 mg | ORAL_CAPSULE | Freq: Two times a day (BID) | ORAL | Status: DC

## 2016-01-13 NOTE — Assessment & Plan Note (Signed)
New problem to me Pt was seen at a non-associated UC and treated with rocephin injection and clindamycin orally Asked her to continue this  Added indomethacin for decreasing pain and swelling (also will help if underlying gout)  Asked her to RTC if fever or other symptoms occur

## 2016-01-13 NOTE — Progress Notes (Signed)
Pre visit review using our clinic review tool, if applicable. No additional management support is needed unless otherwise documented below in the visit note. 

## 2016-01-13 NOTE — Patient Instructions (Signed)
Keep doing what you are doing- finish all antibiotics   Please report diarrhea or fever 100.5 or greater immediately or seek care if on the weekend.   Indomethacin 50 mg capsules- take with food twice daily for pain and swelling

## 2016-01-13 NOTE — Progress Notes (Signed)
Patient ID: Linda Gay, female    DOB: 06/28/1956  Age: 60 y.o. MRN: [id number]  CC: No chief complaint on file.   HPI Linda Gay presents for CC of left elbow pain x 1 week.   1) Elbow- left  UC- Had a shot of rocephin and oral clindamycin- still finishing  Decreased in swelling, warmth, never drained, but had a "pus pocket" she reports Denies diarrhea or fever Feels a little fatigued   History Linda Gay has a past medical history of Thyroid disease.   She has past surgical history that includes Hemorrhoid surgery.   Her family history includes Cancer in her father, maternal grandfather, and paternal grandfather; Diabetes in her paternal grandmother; Heart disease in her maternal grandmother; Hypertension in her brother and mother; Stroke in her maternal grandmother.She reports that she has never smoked. She has never used smokeless tobacco. She reports that she drinks alcohol. She reports that she does not use illicit drugs.  Outpatient Prescriptions Prior to Visit  Medication Sig Dispense Refill  . levothyroxine (SYNTHROID, LEVOTHROID) 112 MCG tablet TAKE 1 TABLET BY MOUTH DAILY 90 tablet 1  . ciprofloxacin (CIPRO) 500 MG tablet Take 1 tablet (500 mg total) by mouth 2 (two) times daily. 6 tablet 0   No facility-administered medications prior to visit.    ROS Review of Systems  Constitutional: Positive for fatigue. Negative for fever, chills and diaphoresis.  Respiratory: Negative for chest tightness, shortness of breath and wheezing.   Cardiovascular: Negative for chest pain, palpitations and leg swelling.  Gastrointestinal: Negative for nausea, vomiting and diarrhea.  Musculoskeletal: Positive for arthralgias.       Left elbow  Skin: Positive for color change.  Neurological: Negative for dizziness and headaches.    Objective:  BP 142/80 mmHg  Pulse 60  Temp(Src) 98.6 F (37 C) (Oral)  Resp 16  Wt 128 lb (58.06 kg)  SpO2 97%  Physical Exam  Constitutional: She is  oriented to person, place, and time. She appears well-developed and well-nourished. No distress.  HENT:  Head: Normocephalic and atraumatic.  Right Ear: External ear normal.  Left Ear: External ear normal.  Cardiovascular: Normal rate, regular rhythm and normal heart sounds.  Exam reveals no gallop and no friction rub.   No murmur heard. Pulmonary/Chest: Effort normal and breath sounds normal. No respiratory distress. She has no wheezes. She has no rales. She exhibits no tenderness.  Neurological: She is alert and oriented to person, place, and time. No cranial nerve deficit. She exhibits normal muscle tone. Coordination normal.  Skin: Skin is warm and dry. No rash noted. She is not diaphoretic. There is erythema.     Erythema, no openings, slight warmth, swelling  Psychiatric: She has a normal mood and affect. Her behavior is normal. Judgment and thought content normal.   Assessment & Plan:   Diagnoses and all orders for this visit:  Pain in left elbow  Other orders -     indomethacin (INDOCIN) 50 MG capsule; Take 1 capsule (50 mg total) by mouth 2 (two) times daily with a meal.  I have discontinued Linda Gay's ciprofloxacin. I am also having her start on indomethacin. Additionally, I am having her maintain her levothyroxine and clindamycin.  Meds ordered this encounter  Medications  . clindamycin (CLEOCIN) 300 MG capsule    Sig: Take 300 mg by mouth 3 (three) times daily.  . indomethacin (INDOCIN) 50 MG capsule    Sig: Take 1 capsule (50 mg total) by mouth  2 (two) times daily with a meal.    Dispense:  10 capsule    Refill:  0    Order Specific Question:  Supervising Provider    Answer:  Crecencio Mc [[id number]]     Follow-up: Return if symptoms worsen or fail to improve.

## 2016-01-23 ENCOUNTER — Other Ambulatory Visit: Payer: Self-pay | Admitting: Internal Medicine

## 2016-01-23 ENCOUNTER — Ambulatory Visit
Admission: RE | Admit: 2016-01-23 | Discharge: 2016-01-23 | Disposition: A | Discharge status: Home / Self Care | Payer: Commercial Managed Care - HMO | Source: Ambulatory Visit | Attending: Internal Medicine | Admitting: Internal Medicine

## 2016-01-23 DIAGNOSIS — M810 Age-related osteoporosis without current pathological fracture: Secondary | ICD-10-CM

## 2016-01-23 DIAGNOSIS — N6320 Unspecified lump in the left breast, unspecified quadrant: Secondary | ICD-10-CM

## 2016-01-26 ENCOUNTER — Encounter: Payer: Self-pay | Admitting: Internal Medicine

## 2016-01-30 ENCOUNTER — Other Ambulatory Visit: Payer: Self-pay | Admitting: Internal Medicine

## 2016-01-30 ENCOUNTER — Ambulatory Visit
Admission: RE | Admit: 2016-01-30 | Discharge: 2016-01-30 | Disposition: A | Discharge status: Home / Self Care | Payer: Commercial Managed Care - HMO | Source: Ambulatory Visit | Attending: Internal Medicine | Admitting: Internal Medicine

## 2016-01-30 DIAGNOSIS — N6320 Unspecified lump in the left breast, unspecified quadrant: Secondary | ICD-10-CM

## 2016-01-30 HISTORY — PX: BREAST BIOPSY: SHX20

## 2016-02-15 ENCOUNTER — Encounter: Payer: Self-pay | Admitting: Internal Medicine

## 2016-03-31 ENCOUNTER — Other Ambulatory Visit: Payer: Self-pay | Admitting: Family Medicine

## 2016-05-04 ENCOUNTER — Encounter: Payer: Self-pay | Admitting: Internal Medicine

## 2016-05-10 ENCOUNTER — Encounter: Payer: Self-pay | Admitting: Internal Medicine

## 2016-05-10 ENCOUNTER — Ambulatory Visit (INDEPENDENT_AMBULATORY_CARE_PROVIDER_SITE_OTHER): Payer: Commercial Managed Care - HMO | Admitting: Internal Medicine

## 2016-05-10 VITALS — BP 144/82 | HR 65 | Temp 98.5°F | Resp 16 | Wt 128.0 lb

## 2016-05-10 DIAGNOSIS — Z23 Encounter for immunization: Secondary | ICD-10-CM | POA: Diagnosis not present

## 2016-05-10 DIAGNOSIS — H9313 Tinnitus, bilateral: Secondary | ICD-10-CM

## 2016-05-10 DIAGNOSIS — M79605 Pain in left leg: Secondary | ICD-10-CM | POA: Diagnosis not present

## 2016-05-10 DIAGNOSIS — H9319 Tinnitus, unspecified ear: Secondary | ICD-10-CM | POA: Insufficient documentation

## 2016-05-10 NOTE — Patient Instructions (Signed)
  If your symptoms worsen or if you want them evaluated further please let me know and I will refer you.   Medications reviewed and updated.  No changes recommended at this time.  You received a shingles vaccine today.

## 2016-05-10 NOTE — Progress Notes (Signed)
Pre visit review using our clinic review tool, if applicable. No additional management support is needed unless otherwise documented below in the visit note. 

## 2016-05-10 NOTE — Progress Notes (Signed)
Subjective:    Patient ID: Linda Gay, female    DOB: [birth year], 60 y.o.   MRN: [id number]  HPI She is here for an acute visit.   Ringing in ears:  She has ringing in her head for about two months.  She denies at the time it started any cold symptoms or loud noise exposure.  She denies cold symptoms, ear pain, plugged ears, sinus pain, fever, headaches, lightheadedness.  She has some nasal congestion intermittently on occasion.  She feels her hearing is good, but her husband thinks she has some hearing problems.    Left leg pain:  It started two weeks ago.  It started when she was sitting at work.  It is intermittent.  It is sharp.  It can occur with any activity.  It does not radiate.  No back pain, no numbness or tingling. In the past two weeks it is less often.  She denies weakness in the leg or swelling in the leg.  No change in back pain - does get some chronic mild back pain.    Medications and allergies reviewed with patient and updated if appropriate.  Patient Active Problem List   Diagnosis Date Noted  . Pain in left elbow 01/13/2016  . Hypothyroid 01/21/2012  . Osteoporosis 10/05/2010    Current Outpatient Prescriptions on File Prior to Visit  Medication Sig Dispense Refill  . levothyroxine (SYNTHROID, LEVOTHROID) 112 MCG tablet TAKE 1 TABLET BY MOUTH DAILY 90 tablet 1   No current facility-administered medications on file prior to visit.    Past Medical History  Diagnosis Date  . Thyroid disease     Past Surgical History  Procedure Laterality Date  . Hemorrhoid surgery      Social History   Social History  . Marital Status: Married    Spouse Name: N/A  . Number of Children: N/A  . Years of Education: N/A   Social History Main Topics  . Smoking status: Never Smoker   . Smokeless tobacco: Never Used  . Alcohol Use: 0.0 oz/week    0 Standard drinks or equivalent per week  . Drug Use: No  . Sexual Activity:    Partners: Male   Other Topics Concern    . 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   Regular exercise: no   Caffeine use: daily    Family History  Problem Relation Age of Onset  . Hypertension Mother   . Cancer Father     prostate  . Hypertension Brother   . Stroke Maternal Grandmother   . Heart disease Maternal Grandmother   . Cancer Maternal Grandfather     lung  . Diabetes Paternal Grandmother   . Cancer Paternal Grandfather     prostate    Review of Systems  Constitutional: Negative for fever.  HENT: Positive for congestion (chornic, mild, intermittent), hearing loss (per husband) and tinnitus. Negative for ear pain, sinus pressure and sore throat.   Skin: Negative for color change and rash.  Neurological: Negative for dizziness, weakness, light-headedness, numbness and headaches.       Objective:   Filed Vitals:   05/10/16 0819  BP: 144/82  Pulse: 65  Temp: 98.5 F (36.9 C)  Resp: 16   Filed Weights   05/10/16 0819  Weight: 128 lb (58.06 kg)   Body mass index is 22.48 kg/(m^2).   Physical Exam  Constitutional: She appears well-developed and  well-nourished. No distress.  HENT:  Head: Normocephalic and atraumatic.  Right Ear: External ear normal.  Left Ear: External ear normal.  Mouth/Throat: Oropharynx is clear and moist.  B/l ear canals and TM normal  Eyes: Conjunctivae are normal.  Neck: Neck supple. No tracheal deviation present. No thyromegaly present.  Musculoskeletal: She exhibits no edema or tenderness (left leg - non-tender in area of pain).  Lymphadenopathy:    She has no cervical adenopathy.  Neurological: She exhibits normal muscle tone. Coordination normal.  Normal strength and sensation LE b/l  Skin: Skin is warm and dry. She is not diaphoretic.          Assessment & Plan:   See Problem List for Assessment and Plan of chronic medical problems.

## 2016-05-10 NOTE — Assessment & Plan Note (Signed)
Leg pain improving Exam is fairly normal  Since it is improving we will just monitor for now Possibly nerve or muscle related Unlikely dvt/clot or arthritis If does not resolve she will let me know so we can evaluate further

## 2016-05-10 NOTE — Assessment & Plan Note (Signed)
Started two months ago ? Hearing loss - yes, per husband Ear exam normal Discussed hearing eval and ENT - she declined today but will let me know if she changes her mind Possible related to hearing loss - discussed protecting hearing

## 2016-05-10 NOTE — Addendum Note (Signed)
Addended by: Terence Lux B on: 05/10/2016 08:58 AM   Modules accepted: Orders

## 2016-09-29 ENCOUNTER — Other Ambulatory Visit: Payer: Self-pay | Admitting: Internal Medicine

## 2016-09-29 DIAGNOSIS — E039 Hypothyroidism, unspecified: Secondary | ICD-10-CM

## 2016-12-28 ENCOUNTER — Other Ambulatory Visit: Payer: Self-pay | Admitting: Internal Medicine

## 2017-01-28 ENCOUNTER — Encounter: Payer: Self-pay | Admitting: Internal Medicine

## 2017-01-28 NOTE — Progress Notes (Signed)
Subjective:    Patient ID: Linda Gay, female    DOB: 1956-01-05, 61 y.o.   MRN: [id number]  HPI She is here for a physical exam.   She has no questions or concerns.  Her BP is elevated today.  She does not check her BP at home.   Hypothyroidism:  She is taking her medication daily.  She denies any recent changes in energy or weight that are unexplained.    Medications and allergies reviewed with patient and updated if appropriate.  Patient Active Problem List   Diagnosis Date Noted  . Tinnitus 05/10/2016  . Leg pain, left 05/10/2016  . Pain in left elbow 01/13/2016  . Hypothyroid 01/21/2012  . Osteoporosis 10/05/2010    No current outpatient prescriptions on file prior to visit.   No current facility-administered medications on file prior to visit.     Past Medical History:  Diagnosis Date  . Thyroid disease     Past Surgical History:  Procedure Laterality Date  . HEMORRHOID SURGERY      Social History   Social History  . Marital status: Married    Spouse name: N/A  . Number of children: N/A  . Years of education: N/A   Social History Main Topics  . Smoking status: Never Smoker  . Smokeless tobacco: Never Used  . Alcohol use 0.0 oz/week  . Drug use: No  . Sexual activity: Yes    Partners: Male   Other Topics Concern  . 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   Regular exercise: no   Caffeine use: daily    Family History  Problem Relation Age of Onset  . Hypertension Mother   . Cancer Father     prostate  . Hypertension Brother   . Stroke Maternal Grandmother   . Heart disease Maternal Grandmother   . Cancer Maternal Grandfather     lung  . Diabetes Paternal Grandmother   . Cancer Paternal Grandfather     prostate    Review of Systems  Constitutional: Negative for appetite change, chills, fatigue, fever and unexpected weight change.  Eyes: Negative for visual disturbance.  Respiratory: Negative for  cough, shortness of breath and wheezing.   Cardiovascular: Negative for chest pain, palpitations and leg swelling.  Gastrointestinal: Negative for abdominal pain, blood in stool, constipation, diarrhea and nausea.       No gerd  Genitourinary: Negative for dysuria and hematuria.  Musculoskeletal: Positive for arthralgias (mild stiffness all over). Negative for back pain.  Skin: Negative for color change and rash.  Neurological: Negative for dizziness, light-headedness and headaches.  Psychiatric/Behavioral: Negative for dysphoric mood. The patient is nervous/anxious (recently).        Objective:   Vitals:   01/29/17 1002 01/29/17 1059  BP: (!) 184/92 (!) 178/90  Pulse: 61   Resp: 16   Temp: 98.8 F (37.1 C)    Filed Weights   01/29/17 1002  Weight: 133 lb (60.3 kg)   Body mass index is 23.56 kg/m.  Wt Readings from Last 3 Encounters:  01/29/17 133 lb (60.3 kg)  05/10/16 128 lb (58.1 kg)  01/13/16 128 lb (58.1 kg)     Physical Exam Constitutional: She appears well-developed and well-nourished. No distress.  HENT:  Head: Normocephalic and atraumatic.  Right Ear: External ear normal. Normal ear canal and TM Left Ear: External ear normal.  Normal ear canal and TM Mouth/Throat: Oropharynx is clear  and moist.  Eyes: Conjunctivae and EOM are normal.  Neck: Neck supple. No tracheal deviation present. No thyromegaly present.  No carotid bruit  Cardiovascular: Normal rate, regular rhythm and normal heart sounds.   No murmur heard.  No edema. Pulmonary/Chest: Effort normal and breath sounds normal. No respiratory distress. She has no wheezes. She has no rales.  Breast: deferred to Gyn Abdominal: Soft. She exhibits no distension. There is no tenderness.  Lymphadenopathy: She has no cervical adenopathy.  Skin: Skin is warm and dry. She is not diaphoretic.  Psychiatric: She has a normal mood and affect. Her behavior is normal.       Assessment & Plan:   Physical  exam: Screening blood work    ordered Immunizations   Up to date  Colonoscopy - due 2018  - will have it this summer Mammogram   Up to date  Gyn - no up to date Dexa   Up to date  Eye exams  Up to date  EKG - last 2011 - no concerning symptoms so will hold off on EKG Exercise - none - stressed regular exercise Weight - BMI normal Skin  - no concerns today - has seen derm last year Substance abuse   none  See Problem List for Assessment and Plan of chronic medical problems.   FU annually, sooner if needed

## 2017-01-28 NOTE — Patient Instructions (Addendum)
Monitor your BP at home - goal is less than 140/90 - ideally less than 130/80.   Test(s) ordered today. Your results will be released to [place] (or called to you) after review, usually within 72hours after test completion. If any changes need to be made, you will be notified at that same time.  All other Health Maintenance issues reviewed.   All recommended immunizations and age-appropriate screenings are up-to-date or discussed.  No immunizations administered today.   Medications reviewed and updated.  No changes recommended at this time.  Your prescription(s) have been submitted to your pharmacy. Please take as directed and contact our office if you believe you are having problem(s) with the medication(s).   Please followup in one year for a physical - sooner if BP is elevated.    Health Maintenance, Female Introduction Adopting a healthy lifestyle and getting preventive care can go a long way to promote health and wellness. Talk with your health care provider about what schedule of regular examinations is right for you. This is a good chance for you to check in with your provider about disease prevention and staying healthy. In between checkups, there are plenty of things you can do on your own. Experts have done a lot of research about which lifestyle changes and preventive measures are most likely to keep you healthy. Ask your health care provider for more information. Weight and diet Eat a healthy diet  Be sure to include plenty of vegetables, fruits, low-fat dairy products, and lean protein.  Do not eat a lot of foods high in solid fats, added sugars, or salt.  Get regular exercise. This is one of the most important things you can do for your health.  Most adults should exercise for at least 150 minutes each week. The exercise should increase your heart rate and make you sweat (moderate-intensity exercise).  Most adults should also do strengthening exercises at least twice a  week. This is in addition to the moderate-intensity exercise. Maintain a healthy weight  Body mass index (BMI) is a measurement that can be used to identify possible weight problems. It estimates body fat based on height and weight. Your health care provider can help determine your BMI and help you achieve or maintain a healthy weight.  For females 38 years of age and older:  A BMI below 18.5 is considered underweight.  A BMI of 18.5 to 24.9 is normal.  A BMI of 25 to 29.9 is considered overweight.  A BMI of 30 and above is considered obese. Watch levels of cholesterol and blood lipids  You should start having your blood tested for lipids and cholesterol at 63 years of age, then have this test every 5 years.  You may need to have your cholesterol levels checked more often if:  Your lipid or cholesterol levels are high.  You are older than 63 years of age.  You are at high risk for heart disease. Cancer screening Lung Cancer  Lung cancer screening is recommended for adults 87-48 years old who are at high risk for lung cancer because of a history of smoking.  A yearly low-dose CT scan of the lungs is recommended for people who:  Currently smoke.  Have quit within the past 15 years.  Have at least a 30-pack-year history of smoking. A pack year is smoking an average of one pack of cigarettes a day for 1 year.  Yearly screening should continue until it has been 15 years since you quit.  Yearly screening should stop if you develop a health problem that would prevent you from having lung cancer treatment. Breast Cancer  Practice breast self-awareness. This means understanding how your breasts normally appear and feel.  It also means doing regular breast self-exams. Let your health care provider know about any changes, no matter how small.  If you are in your 20s or 30s, you should have a clinical breast exam (CBE) by a health care provider every 1-3 years as part of a regular  health exam.  If you are 49 or older, have a CBE every year. Also consider having a breast X-ray (mammogram) every year.  If you have a family history of breast cancer, talk to your health care provider about genetic screening.  If you are at high risk for breast cancer, talk to your health care provider about having an MRI and a mammogram every year.  Breast cancer gene (BRCA) assessment is recommended for women who have family members with BRCA-related cancers. BRCA-related cancers include:  Breast.  Ovarian.  Tubal.  Peritoneal cancers.  Results of the assessment will determine the need for genetic counseling and BRCA1 and BRCA2 testing. Cervical Cancer  Your health care provider may recommend that you be screened regularly for cancer of the pelvic organs (ovaries, uterus, and vagina). This screening involves a pelvic examination, including checking for microscopic changes to the surface of your cervix (Pap test). You may be encouraged to have this screening done every 3 years, beginning at age 75.  For women ages 65-65, health care providers may recommend pelvic exams and Pap testing every 3 years, or they may recommend the Pap and pelvic exam, combined with testing for human papilloma virus (HPV), every 5 years. Some types of HPV increase your risk of cervical cancer. Testing for HPV may also be done on women of any age with unclear Pap test results.  Other health care providers may not recommend any screening for nonpregnant women who are considered low risk for pelvic cancer and who do not have symptoms. Ask your health care provider if a screening pelvic exam is right for you.  If you have had past treatment for cervical cancer or a condition that could lead to cancer, you need Pap tests and screening for cancer for at least 20 years after your treatment. If Pap tests have been discontinued, your risk factors (such as having a new sexual partner) need to be reassessed to determine  if screening should resume. Some women have medical problems that increase the chance of getting cervical cancer. In these cases, your health care provider may recommend more frequent screening and Pap tests. Colorectal Cancer  This type of cancer can be detected and often prevented.  Routine colorectal cancer screening usually begins at 63 years of age and continues through 63 years of age.  Your health care provider may recommend screening at an earlier age if you have risk factors for colon cancer.  Your health care provider may also recommend using home test kits to check for hidden blood in the stool.  A small camera at the end of a tube can be used to examine your colon directly (sigmoidoscopy or colonoscopy). This is done to check for the earliest forms of colorectal cancer.  Routine screening usually begins at age 16.  Direct examination of the colon should be repeated every 5-10 years through 63 years of age. However, you may need to be screened more often if early forms of precancerous polyps or  small growths are found. Skin Cancer  Check your skin from head to toe regularly.  Tell your health care provider about any new moles or changes in moles, especially if there is a change in a mole's shape or color.  Also tell your health care provider if you have a mole that is larger than the size of a pencil eraser.  Always use sunscreen. Apply sunscreen liberally and repeatedly throughout the day.  Protect yourself by wearing long sleeves, pants, a wide-brimmed hat, and sunglasses whenever you are outside. Heart disease, diabetes, and high blood pressure  High blood pressure causes heart disease and increases the risk of stroke. High blood pressure is more likely to develop in:  People who have blood pressure in the high end of the normal range (130-139/85-89 mm Hg).  People who are overweight or obese.  People who are African American.  If you are 26-67 years of age, have  your blood pressure checked every 3-5 years. If you are 16 years of age or older, have your blood pressure checked every year. You should have your blood pressure measured twice-once when you are at a hospital or clinic, and once when you are not at a hospital or clinic. Record the average of the two measurements. To check your blood pressure when you are not at a hospital or clinic, you can use:  An automated blood pressure machine at a pharmacy.  A home blood pressure monitor.  If you are between 75 years and 13 years old, ask your health care provider if you should take aspirin to prevent strokes.  Have regular diabetes screenings. This involves taking a blood sample to check your fasting blood sugar level.  If you are at a normal weight and have a low risk for diabetes, have this test once every three years after 63 years of age.  If you are overweight and have a high risk for diabetes, consider being tested at a younger age or more often. Preventing infection Hepatitis B  If you have a higher risk for hepatitis B, you should be screened for this virus. You are considered at high risk for hepatitis B if:  You were born in a country where hepatitis B is common. Ask your health care provider which countries are considered high risk.  Your parents were born in a high-risk country, and you have not been immunized against hepatitis B (hepatitis B vaccine).  You have HIV or AIDS.  You use needles to inject street drugs.  You live with someone who has hepatitis B.  You have had sex with someone who has hepatitis B.  You get hemodialysis treatment.  You take certain medicines for conditions, including cancer, organ transplantation, and autoimmune conditions. Hepatitis C  Blood testing is recommended for:  Everyone born from [birth year] through 1967.  Anyone with known risk factors for hepatitis C. Sexually transmitted infections (STIs)  You should be screened for sexually transmitted  infections (STIs) including gonorrhea and chlamydia if:  You are sexually active and are younger than 63 years of age.  You are older than 63 years of age and your health care provider tells you that you are at risk for this type of infection.  Your sexual activity has changed since you were last screened and you are at an increased risk for chlamydia or gonorrhea. Ask your health care provider if you are at risk.  If you do not have HIV, but are at risk, it may be recommended that  you take a prescription medicine daily to prevent HIV infection. This is called pre-exposure prophylaxis (PrEP). You are considered at risk if:  You are sexually active and do not regularly use condoms or know the HIV status of your partner(s).  You take drugs by injection.  You are sexually active with a partner who has HIV. Talk with your health care provider about whether you are at high risk of being infected with HIV. If you choose to begin PrEP, you should first be tested for HIV. You should then be tested every 3 months for as long as you are taking PrEP. Pregnancy  If you are premenopausal and you may become pregnant, ask your health care provider about preconception counseling.  If you may become pregnant, take 400 to 800 micrograms (mcg) of folic acid every day.  If you want to prevent pregnancy, talk to your health care provider about birth control (contraception). Osteoporosis and menopause  Osteoporosis is a disease in which the bones lose minerals and strength with aging. This can result in serious bone fractures. Your risk for osteoporosis can be identified using a bone density scan.  If you are 91 years of age or older, or if you are at risk for osteoporosis and fractures, ask your health care provider if you should be screened.  Ask your health care provider whether you should take a calcium or vitamin D supplement to lower your risk for osteoporosis.  Menopause may have certain physical  symptoms and risks.  Hormone replacement therapy may reduce some of these symptoms and risks. Talk to your health care provider about whether hormone replacement therapy is right for you. Follow these instructions at home:  Schedule regular health, dental, and eye exams.  Stay current with your immunizations.  Do not use any tobacco products including cigarettes, chewing tobacco, or electronic cigarettes.  If you are pregnant, do not drink alcohol.  If you are breastfeeding, limit how much and how often you drink alcohol.  Limit alcohol intake to no more than 1 drink per day for nonpregnant women. One drink equals 12 ounces of beer, 5 ounces of wine, or 1 ounces of hard liquor.  Do not use street drugs.  Do not share needles.  Ask your health care provider for help if you need support or information about quitting drugs.  Tell your health care provider if you often feel depressed.  Tell your health care provider if you have ever been abused or do not feel safe at home. This information is not intended to replace advice given to you by your health care provider. Make sure you discuss any questions you have with your health care provider. Document Released: 06/04/2011 Document Revised: 04/26/2016 Document Reviewed: 08/23/2015  2017 Elsevier

## 2017-01-29 ENCOUNTER — Ambulatory Visit (INDEPENDENT_AMBULATORY_CARE_PROVIDER_SITE_OTHER): Payer: 59 | Admitting: Internal Medicine

## 2017-01-29 ENCOUNTER — Encounter: Payer: Self-pay | Admitting: Internal Medicine

## 2017-01-29 VITALS — BP 178/90 | HR 61 | Temp 98.8°F | Resp 16 | Ht 63.0 in | Wt 133.0 lb

## 2017-01-29 DIAGNOSIS — M810 Age-related osteoporosis without current pathological fracture: Secondary | ICD-10-CM | POA: Diagnosis not present

## 2017-01-29 DIAGNOSIS — E039 Hypothyroidism, unspecified: Secondary | ICD-10-CM

## 2017-01-29 DIAGNOSIS — R030 Elevated blood-pressure reading, without diagnosis of hypertension: Secondary | ICD-10-CM

## 2017-01-29 DIAGNOSIS — Z0000 Encounter for general adult medical examination without abnormal findings: Secondary | ICD-10-CM | POA: Diagnosis not present

## 2017-01-29 MED ORDER — LEVOTHYROXINE SODIUM 112 MCG PO TABS: 112.0000 ug | ORAL_TABLET | Freq: Every day | ORAL | 0 refills | Status: DC

## 2017-01-29 NOTE — Progress Notes (Signed)
Pre visit review using our clinic review tool, if applicable. No additional management support is needed unless otherwise documented below in the visit note. 

## 2017-01-29 NOTE — Assessment & Plan Note (Signed)
Check tsh  Titrate med dose if needed  

## 2017-01-29 NOTE — Assessment & Plan Note (Signed)
BP elevated today and on repeat Stressed monitoring BP at home Start regular exercise Low sodium diet If BP elevated at home - needs to return to start medication Check labs - make sure thyroid is in normal range

## 2017-01-29 NOTE — Assessment & Plan Note (Signed)
dexa up to date Was on fosamax - had joint pain Not taking calcium or vitamin d - advised to start Stressed regular exercise Consider trying a different medication other than fosamax, but discussed she may have the same side effects

## 2017-01-30 NOTE — Telephone Encounter (Signed)
Patient is calling about this. She needs her labs put in. She is able to come here. She asked for you to call her once they are in so she knows to come in. Thank you.

## 2017-01-31 ENCOUNTER — Other Ambulatory Visit (INDEPENDENT_AMBULATORY_CARE_PROVIDER_SITE_OTHER): Payer: 59

## 2017-01-31 DIAGNOSIS — E039 Hypothyroidism, unspecified: Secondary | ICD-10-CM | POA: Diagnosis not present

## 2017-01-31 LAB — TSH: TSH: 2.46 u[IU]/mL (ref 0.35–4.50)

## 2017-02-03 ENCOUNTER — Telehealth: Payer: Self-pay | Admitting: Internal Medicine

## 2017-02-03 NOTE — Telephone Encounter (Signed)
See last patient email -- she only had a tsh done -- she needed all her physical labs entered - lipid panel, cmp, cbc and tsh.

## 2017-02-05 ENCOUNTER — Other Ambulatory Visit: Payer: Self-pay | Admitting: Emergency Medicine

## 2017-02-05 DIAGNOSIS — Z0000 Encounter for general adult medical examination without abnormal findings: Secondary | ICD-10-CM

## 2017-02-05 DIAGNOSIS — E039 Hypothyroidism, unspecified: Secondary | ICD-10-CM

## 2017-02-05 NOTE — Progress Notes (Unsigned)
Labs entered as requested by Dr Quay Burow.

## 2017-02-05 NOTE — Telephone Encounter (Signed)
Spoke with pt. See last results note.

## 2017-02-06 ENCOUNTER — Other Ambulatory Visit (INDEPENDENT_AMBULATORY_CARE_PROVIDER_SITE_OTHER): Payer: 59

## 2017-02-06 ENCOUNTER — Encounter: Payer: Self-pay | Admitting: Internal Medicine

## 2017-02-06 DIAGNOSIS — Z0000 Encounter for general adult medical examination without abnormal findings: Secondary | ICD-10-CM

## 2017-02-06 DIAGNOSIS — E039 Hypothyroidism, unspecified: Secondary | ICD-10-CM

## 2017-02-06 LAB — COMPREHENSIVE METABOLIC PANEL
ALT: 11 U/L (ref 0–35)
AST: 14 U/L (ref 0–37)
Albumin: 4.4 g/dL (ref 3.5–5.2)
Alkaline Phosphatase: 55 U/L (ref 39–117)
BUN: 14 mg/dL (ref 6–23)
CHLORIDE: 104 meq/L (ref 96–112)
CO2: 29 meq/L (ref 19–32)
Calcium: 9.7 mg/dL (ref 8.4–10.5)
Creatinine, Ser: 0.79 mg/dL (ref 0.40–1.20)
GFR: 78.18 mL/min (ref >60.00)
Glucose, Bld: 105 mg/dL — ABNORMAL HIGH (ref 70–99)
POTASSIUM: 4.2 meq/L (ref 3.5–5.1)
SODIUM: 141 meq/L (ref 135–145)
Total Bilirubin: 0.6 mg/dL (ref 0.2–1.2)
Total Protein: 6.7 g/dL (ref 6.0–8.3)

## 2017-02-06 LAB — CBC
HEMATOCRIT: 38.4 % (ref 36.0–46.0)
HEMOGLOBIN: 13.6 g/dL (ref 12.0–15.0)
MCHC: 35.5 g/dL (ref 30.0–36.0)
MCV: 86.6 fl (ref 78.0–100.0)
PLATELETS: 197 10*3/uL (ref 150.0–400.0)
RBC: 4.43 Mil/uL (ref 3.87–5.11)
RDW: 13.4 % (ref 11.5–15.5)
WBC: 3.5 10*3/uL — AB (ref 4.0–10.5)

## 2017-02-06 LAB — LIPID PANEL
CHOL/HDL RATIO: 3
Cholesterol: 214 mg/dL — ABNORMAL HIGH (ref 0–200)
HDL: 82.9 mg/dL (ref >39.00)
LDL Cholesterol: 120 mg/dL — ABNORMAL HIGH (ref 0–99)
NONHDL: 131.46
Triglycerides: 57 mg/dL (ref 0.0–149.0)
VLDL: 11.4 mg/dL (ref 0.0–40.0)

## 2017-02-22 ENCOUNTER — Other Ambulatory Visit: Payer: Self-pay | Admitting: Emergency Medicine

## 2017-02-22 MED ORDER — LEVOTHYROXINE SODIUM 112 MCG PO TABS: 112.0000 ug | ORAL_TABLET | Freq: Every day | ORAL | 1 refills | Status: DC

## 2017-02-28 ENCOUNTER — Other Ambulatory Visit: Payer: Self-pay | Admitting: Internal Medicine

## 2017-03-05 ENCOUNTER — Encounter: Payer: Self-pay | Admitting: Internal Medicine

## 2017-03-05 MED ORDER — LEVOTHYROXINE SODIUM 112 MCG PO TABS: 112.0000 ug | ORAL_TABLET | Freq: Every day | ORAL | 1 refills | Status: DC

## 2017-03-15 ENCOUNTER — Other Ambulatory Visit: Payer: Self-pay | Admitting: Emergency Medicine

## 2017-03-15 MED ORDER — ESCITALOPRAM OXALATE 10 MG PO TABS: 10.0000 mg | ORAL_TABLET | Freq: Every day | ORAL | 5 refills | Status: DC

## 2017-03-15 MED ORDER — ALPRAZOLAM 0.5 MG PO TABS: 0.5000 mg | ORAL_TABLET | Freq: Every evening | ORAL | 1 refills | Status: DC | PRN

## 2017-03-15 NOTE — Telephone Encounter (Signed)
Spoke with pt to inform. RXs have been called into pharmacy.

## 2017-03-15 NOTE — Telephone Encounter (Signed)
Please express my condolences.  No need to come in.   lexapro 10 mg daily - this can be increased to 20 mg if needed.   Xanax at night.   Prescriptions pending - please send where she wants them sent.

## 2017-03-15 NOTE — Telephone Encounter (Signed)
Pt called and would like to inquire about sleeping and anti-depressant medication due to her husband recently passing. She has taking Lexapro in the past and states she is using a friends Xanax to sleep at night. She does not want this to be long turn but states she is unable to function during this time. Please advise, she said she was willing to come in to be seen if needed.

## 2017-09-09 ENCOUNTER — Other Ambulatory Visit: Payer: Self-pay | Admitting: Internal Medicine

## 2017-09-15 DIAGNOSIS — Z23 Encounter for immunization: Secondary | ICD-10-CM | POA: Diagnosis not present

## 2018-01-08 ENCOUNTER — Ambulatory Visit: Payer: No Typology Code available for payment source | Admitting: Nurse Practitioner

## 2018-01-10 DIAGNOSIS — H35413 Lattice degeneration of retina, bilateral: Secondary | ICD-10-CM | POA: Diagnosis not present

## 2018-01-29 ENCOUNTER — Encounter: Payer: Self-pay | Admitting: Family Medicine

## 2018-01-29 ENCOUNTER — Other Ambulatory Visit (HOSPITAL_COMMUNITY)
Admission: RE | Admit: 2018-01-29 | Discharge: 2018-01-29 | Disposition: A | Discharge status: Home / Self Care | Payer: BLUE CROSS/BLUE SHIELD | Source: Ambulatory Visit | Attending: Family Medicine | Admitting: Family Medicine

## 2018-01-29 ENCOUNTER — Ambulatory Visit (INDEPENDENT_AMBULATORY_CARE_PROVIDER_SITE_OTHER): Payer: BLUE CROSS/BLUE SHIELD | Admitting: Family Medicine

## 2018-01-29 VITALS — BP 120/68 | HR 62 | Temp 98.7°F | Ht 63.0 in | Wt 134.0 lb

## 2018-01-29 DIAGNOSIS — E039 Hypothyroidism, unspecified: Secondary | ICD-10-CM | POA: Diagnosis not present

## 2018-01-29 DIAGNOSIS — Z1231 Encounter for screening mammogram for malignant neoplasm of breast: Secondary | ICD-10-CM | POA: Diagnosis not present

## 2018-01-29 DIAGNOSIS — Z780 Asymptomatic menopausal state: Secondary | ICD-10-CM | POA: Diagnosis not present

## 2018-01-29 DIAGNOSIS — F4321 Adjustment disorder with depressed mood: Secondary | ICD-10-CM

## 2018-01-29 DIAGNOSIS — Z1239 Encounter for other screening for malignant neoplasm of breast: Secondary | ICD-10-CM

## 2018-01-29 DIAGNOSIS — Z01419 Encounter for gynecological examination (general) (routine) without abnormal findings: Secondary | ICD-10-CM | POA: Diagnosis not present

## 2018-01-29 DIAGNOSIS — Z0000 Encounter for general adult medical examination without abnormal findings: Secondary | ICD-10-CM | POA: Insufficient documentation

## 2018-01-29 DIAGNOSIS — Z1211 Encounter for screening for malignant neoplasm of colon: Secondary | ICD-10-CM | POA: Diagnosis not present

## 2018-01-29 LAB — COMPREHENSIVE METABOLIC PANEL
ALT: 13 U/L (ref 0–35)
AST: 13 U/L (ref 0–37)
Albumin: 4.1 g/dL (ref 3.5–5.2)
Alkaline Phosphatase: 55 U/L (ref 39–117)
BUN: 16 mg/dL (ref 6–23)
CHLORIDE: 103 meq/L (ref 96–112)
CO2: 31 meq/L (ref 19–32)
CREATININE: 0.7 mg/dL (ref 0.40–1.20)
Calcium: 9.6 mg/dL (ref 8.4–10.5)
GFR: 89.61 mL/min (ref >60.00)
GLUCOSE: 98 mg/dL (ref 70–99)
Potassium: 4.4 mEq/L (ref 3.5–5.1)
SODIUM: 140 meq/L (ref 135–145)
Total Bilirubin: 0.6 mg/dL (ref 0.2–1.2)
Total Protein: 6.7 g/dL (ref 6.0–8.3)

## 2018-01-29 LAB — LIPID PANEL
CHOL/HDL RATIO: 2
Cholesterol: 207 mg/dL — ABNORMAL HIGH (ref 0–200)
HDL: 84.8 mg/dL (ref >39.00)
LDL CALC: 111 mg/dL — AB (ref 0–99)
NonHDL: 121.75
TRIGLYCERIDES: 53 mg/dL (ref 0.0–149.0)
VLDL: 10.6 mg/dL (ref 0.0–40.0)

## 2018-01-29 LAB — CBC WITH DIFFERENTIAL/PLATELET
BASOS PCT: 0.6 % (ref 0.0–3.0)
Basophils Absolute: 0 10*3/uL (ref 0.0–0.1)
EOS ABS: 0.1 10*3/uL (ref 0.0–0.7)
Eosinophils Relative: 3.6 % (ref 0.0–5.0)
HCT: 36.9 % (ref 36.0–46.0)
Hemoglobin: 13.1 g/dL (ref 12.0–15.0)
Lymphocytes Relative: 28.1 % (ref 12.0–46.0)
Lymphs Abs: 0.9 10*3/uL (ref 0.7–4.0)
MCHC: 35.5 g/dL (ref 30.0–36.0)
MCV: 85.4 fl (ref 78.0–100.0)
MONO ABS: 0.2 10*3/uL (ref 0.1–1.0)
Monocytes Relative: 5.3 % (ref 3.0–12.0)
NEUTROS ABS: 2 10*3/uL (ref 1.4–7.7)
NEUTROS PCT: 62.4 % (ref 43.0–77.0)
PLATELETS: 196 10*3/uL (ref 150.0–400.0)
RBC: 4.32 Mil/uL (ref 3.87–5.11)
RDW: 12.8 % (ref 11.5–15.5)
WBC: 3.2 10*3/uL — AB (ref 4.0–10.5)

## 2018-01-29 LAB — TSH: TSH: 1.28 u[IU]/mL (ref 0.35–4.50)

## 2018-01-29 LAB — T4, FREE: FREE T4: 1.1 ng/dL (ref 0.60–1.60)

## 2018-01-29 MED ORDER — LEVOTHYROXINE SODIUM 112 MCG PO TABS: ORAL_TABLET | ORAL | 1 refills | Status: DC

## 2018-01-29 NOTE — Progress Notes (Signed)
Subjective:   Patient ID: Linda Gay, female    DOB: [birth year], 62 y.o.   MRN: [id number]  Linda Gay is a pleasant 62 y.o. year old female who presents to clinic today with Annual Exam (Patient is here here today for a CPE with PAP.  She had her flu shot in Oct at [facility].  She agrees to a referral to GI for consult for Colonoscopy.  Would like a referral for Mammogram and Dexa scan.  She is currently fasting.)  on 01/29/2018  HPI:  Patient is new to me.  She is due for pap smear, mammogram, DEXA and colonoscopy.  Hypothyroidism:  She is taking her synthroid daily.  She denies any recent changes in energy or weight that are unexplained.   Lab Results  Component Value Date   TSH 2.46 02-04-17   Grief- husband died in Apr 05, 2023 and has been lexapro 10 mg daily and xanax qhs for insomnia since. She feels she is coping well.   Health Maintenance  Topic Date Due  . PAP SMEAR  09/04/2016  . COLONOSCOPY  09/04/2016  . DEXA SCAN  01/22/2018  . MAMMOGRAM  01/29/2018  . TETANUS/TDAP  10/05/2020  . INFLUENZA VACCINE  Completed  . Hepatitis C Screening  Completed  . HIV Screening  Completed     Current Outpatient Medications on File Prior to Visit  Medication Sig Dispense Refill  . ALPRAZolam (XANAX) 0.5 MG tablet Take 1 tablet (0.5 mg total) by mouth at bedtime as needed for anxiety. 30 tablet 1  . escitalopram (LEXAPRO) 10 MG tablet TAKE 1 TABLET BY MOUTH EVERY DAY 90 tablet 1   No current facility-administered medications on file prior to visit.     No Known Allergies  Past Medical History:  Diagnosis Date  . Thyroid disease     Past Surgical History:  Procedure Laterality Date  . HEMORRHOID SURGERY      Family History  Problem Relation Age of Onset  . Hypertension Mother   . Cancer Father        prostate  . Hypertension Brother   . Stroke Maternal Grandmother   . Heart disease Maternal Grandmother   . Cancer Maternal Grandfather        lung  . Diabetes  Paternal Grandmother   . Cancer Paternal Grandfather        prostate    Social History   Socioeconomic History  . Marital status: Married    Spouse name: Not on file  . Number of children: Not on file  . Years of education: Not on file  . Highest education level: Not on file  Social Needs  . Financial resource strain: Not on file  . Food insecurity - worry: Not on file  . Food insecurity - inability: Not on file  . Transportation needs - medical: Not on file  . Transportation needs - non-medical: Not on file  Occupational History  . Not on file  Tobacco Use  . Smoking status: Never Smoker  . Smokeless tobacco: Never Used  Substance and Sexual Activity  . Alcohol use: Yes    Alcohol/week: 0.0 oz  . Drug use: No  . Sexual activity: Yes    Partners: Male  Other Topics Concern  . 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   Regular exercise: no   Caffeine use: daily   The PMH, PSH, Social History, Family History, Medications, and allergies  have been reviewed in [facility], and have been updated if relevant.   Review of Systems  Constitutional: Negative.   HENT: Negative.   Eyes: Negative.   Respiratory: Negative.   Cardiovascular: Negative.   Gastrointestinal: Negative.   Endocrine: Negative.   Genitourinary: Negative.   Musculoskeletal: Negative.   Skin: Negative.   Allergic/Immunologic: Negative.   Neurological: Negative.   Hematological: Negative.   Psychiatric/Behavioral: Negative.   All other systems reviewed and are negative.      Objective:    BP 120/68 (BP Location: Left Arm, Patient Position: Sitting, Cuff Size: Normal)   Pulse 62   Temp 98.7 F (37.1 C) (Oral)   Ht 5\' 3"  (1.6 m)   Wt 134 lb (60.8 kg)   SpO2 98%   BMI 23.74 kg/m    Physical Exam   General:  Well-developed,well-nourished,in no acute distress; alert,appropriate and cooperative throughout examination Head:  normocephalic and atraumatic.   Eyes:   vision grossly intact, PERRL Ears:  R ear normal and L ear normal externally, TMs clear bilaterally Nose:  no external deformity.   Mouth:  good dentition.   Neck:  No deformities, masses, or tenderness noted. Breasts:  No mass, nodules, thickening, tenderness, bulging, retraction, inflamation, nipple discharge or skin changes noted.   Lungs:  Normal respiratory effort, chest expands symmetrically. Lungs are clear to auscultation, no crackles or wheezes. Heart:  Normal rate and regular rhythm. S1 and S2 normal without gallop, murmur, click, rub or other extra sounds. Abdomen:  Bowel sounds positive,abdomen soft and non-tender without masses, organomegaly or hernias noted. Rectal:  no external abnormalities.   Genitalia:  Pelvic Exam:        External: normal female genitalia without lesions or masses        Vagina: normal without lesions or masses        Cervix: normal without lesions or masses        Adnexa: normal bimanual exam without masses or fullness        Uterus: normal by palpation        Pap smear: performed Msk:  No deformity or scoliosis noted of thoracic or lumbar spine.   Extremities:  No clubbing, cyanosis, edema, or deformity noted with normal full range of motion of all joints.   Neurologic:  alert & oriented X3 and gait normal.   Skin:  Intact without suspicious lesions or rashes Cervical Nodes:  No lymphadenopathy noted Axillary Nodes:  No palpable lymphadenopathy Psych:  Cognition and judgment appear intact. Alert and cooperative with normal attention span and concentration. No apparent delusions, illusions, hallucinations       Assessment & Plan:   Well woman exam with routine gynecological exam - Plan: Cytology - PAP  Hypothyroidism, unspecified type - Plan: CBC with Differential/Platelet, Comprehensive metabolic panel, Lipid panel, TSH, T4, free  Screening for malignant neoplasm of colon - Plan: Ambulatory referral to Gastroenterology  Screening for breast  cancer - Plan: MM Digital Screening  Post-menopausal - Plan: DG Bone Density  Grief No Follow-up on file.

## 2018-01-29 NOTE — Assessment & Plan Note (Signed)
Appears quite appropriate. Continue lexapro and nightly xanax.  We reassess in 6 months. The patient indicates understanding of these issues and agrees with the plan.

## 2018-01-29 NOTE — Patient Instructions (Signed)
Great to meet you. I will call you with your lab results from today and you can view them online.   Please call the breast center at [phone] to schedule your mammogram.  We will call you with a referral to see a GI doctor for a colonoscopy.

## 2018-01-29 NOTE — Assessment & Plan Note (Signed)
Continue current dose of synthroid.  Check labs today. 

## 2018-01-29 NOTE — Assessment & Plan Note (Signed)
Reviewed preventive care protocols, scheduled due services, and updated immunizations Discussed nutrition, exercise, diet, and healthy lifestyle.  Orders Placed This Encounter  Procedures  . MM Digital Screening  . DG Bone Density  . CBC with Differential/Platelet  . Comprehensive metabolic panel  . Lipid panel  . TSH  . T4, free  . Ambulatory referral to Gastroenterology

## 2018-01-30 LAB — CYTOLOGY - PAP
Bacterial vaginitis: NEGATIVE
Candida vaginitis: NEGATIVE
Diagnosis: NEGATIVE
HPV: NOT DETECTED

## 2018-03-18 ENCOUNTER — Other Ambulatory Visit: Payer: Self-pay | Admitting: Internal Medicine

## 2018-03-27 ENCOUNTER — Telehealth: Payer: Self-pay

## 2018-03-27 NOTE — Telephone Encounter (Signed)
TB-Plz see below labs ordered at wellness visit with Dx for each  FT4: E03.9 (Hypothyroidism) TSH- E03.9 Lipid- E03.9 CMP- E03.9 CBC w/diff- E03.9 PAP- Z01.419 (WWE with routine GYN)  Lipid should have Dx of E78.5 for Hyperlipidemia (per previous lipid panels) CMP should have Dx of R03.0 for Elevated BP readings & E78.5 CBC w/diff should have Dx of R03.0 & E78.5 FT4 & TSH should be fine with the E03.9 and paid for  But I also think that ALL the labs should be billed with not only what is stated above in the 2nd section but also the Z01.419 for the Wellness visit/I think that this would fix the issue with billing/plz advise/thx dmf  Copied from [place] [phone]. Topic: Bill or Statement - Patient/Guarantor Inquiry >> Mar 26, 2018  4:16 PM Oliver Pila B wrote: Pt called to ask why certain test were run for her labs and why these charges are not being paid by insurance, pt has contacted her insurance provider already

## 2018-03-31 ENCOUNTER — Encounter: Payer: Self-pay | Admitting: Family Medicine

## 2018-05-02 DIAGNOSIS — L821 Other seborrheic keratosis: Secondary | ICD-10-CM | POA: Diagnosis not present

## 2018-05-02 DIAGNOSIS — B078 Other viral warts: Secondary | ICD-10-CM | POA: Diagnosis not present

## 2018-05-02 DIAGNOSIS — L820 Inflamed seborrheic keratosis: Secondary | ICD-10-CM | POA: Diagnosis not present

## 2018-05-02 DIAGNOSIS — L738 Other specified follicular disorders: Secondary | ICD-10-CM | POA: Diagnosis not present

## 2018-05-02 DIAGNOSIS — D1801 Hemangioma of skin and subcutaneous tissue: Secondary | ICD-10-CM | POA: Diagnosis not present

## 2018-05-02 DIAGNOSIS — L812 Freckles: Secondary | ICD-10-CM | POA: Diagnosis not present

## 2018-06-19 ENCOUNTER — Other Ambulatory Visit: Payer: Self-pay | Admitting: Internal Medicine

## 2018-08-05 ENCOUNTER — Other Ambulatory Visit: Payer: Self-pay | Admitting: Family Medicine

## 2018-08-06 NOTE — Telephone Encounter (Signed)
SB-Plz see refill req/thx dmf 

## 2018-11-12 DIAGNOSIS — Z23 Encounter for immunization: Secondary | ICD-10-CM | POA: Diagnosis not present

## 2018-12-21 ENCOUNTER — Other Ambulatory Visit: Payer: Self-pay | Admitting: Internal Medicine

## 2019-02-01 NOTE — Progress Notes (Signed)
Subjective:    Patient ID: Linda Gay, female    DOB: 1956-11-16, 63 y.o.   MRN: [id number]  HPI The patient is here for follow up.  Since she was here last her father in law died.    Hypothyroidism:  She is taking her medication daily.  She denies any recent changes in energy or weight that are unexplained.   Anxiety, Depression, grieving: She is taking the lexapro daily, but only taking 5 mg and she is slowly weaning off of it.  She wants to come off to it and no longer thinks she needs it.  Since coming off of it she has had electric like sensation and wondered if that was normal.  She takes xanax at bedtime and that does help her sleep.  She denies any side effects from the medication.   She is not exericing regularly.    She fell in September - tripped over the dog and landed hard on the ceramic floor.  She has had right sided neck pain since then.    She wondered if that was normal to still have pain.    Medications and allergies reviewed with patient and updated if appropriate.  Patient Active Problem List   Diagnosis Date Noted  . Grief 01/29/2018  . Elevated blood pressure reading 01/29/2017  . Hypothyroid 01/21/2012  . Osteoporosis 10/05/2010    Current Outpatient Medications on File Prior to Visit  Medication Sig Dispense Refill  . ALPRAZolam (XANAX) 0.5 MG tablet Take 1 tablet (0.5 mg total) by mouth at bedtime as needed for anxiety. 30 tablet 1  . escitalopram (LEXAPRO) 10 MG tablet TAKE 1 TABLET BY MOUTH EVERY DAY 90 tablet 1  . levothyroxine (SYNTHROID, LEVOTHROID) 112 MCG tablet TAKE 1 TABLET(112 MCG) BY MOUTH DAILY 90 tablet 1   No current facility-administered medications on file prior to visit.     Past Medical History:  Diagnosis Date  . Thyroid disease     Past Surgical History:  Procedure Laterality Date  . HEMORRHOID SURGERY      Social History   Socioeconomic History  . Marital status: Married    Spouse name: Not on file  . Number of  children: Not on file  . Years of education: Not on file  . Highest education level: Not on file  Occupational History  . Not on file  Social Needs  . Financial resource strain: Not on file  . Food insecurity:    Worry: Not on file    Inability: Not on file  . Transportation needs:    Medical: Not on file    Non-medical: Not on file  Tobacco Use  . Smoking status: Never Smoker  . Smokeless tobacco: Never Used  Substance and Sexual Activity  . Alcohol use: Yes    Alcohol/week: 0.0 standard drinks  . Drug use: No  . Sexual activity: Yes    Partners: Male  Lifestyle  . Physical activity:    Days per week: Not on file    Minutes per session: Not on file  . Stress: Not on file  Relationships  . Social connections:    Talks on phone: Not on file    Gets together: Not on file    Attends religious service: Not on file    Active member of club or organization: Not on file    Attends meetings of clubs or organizations: Not on file    Relationship status: Not on file  Other Topics Concern  .  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   Regular exercise: no   Caffeine use: daily    Family History  Problem Relation Age of Onset  . Hypertension Mother   . Cancer Father        prostate  . Hypertension Brother   . Stroke Maternal Grandmother   . Heart disease Maternal Grandmother   . Cancer Maternal Grandfather        lung  . Diabetes Paternal Grandmother   . Cancer Paternal Grandfather        prostate    Review of Systems  Constitutional: Negative for chills, fatigue and fever.  Respiratory: Negative for cough, shortness of breath and wheezing.   Cardiovascular: Negative for chest pain, palpitations and leg swelling.  Musculoskeletal: Positive for neck pain (right lateral pain).  Neurological: Positive for headaches (getting better when coming off lexapro). Negative for light-headedness.       Objective:   Vitals:   02/02/19 1413    BP: (!) 164/72  Pulse: 60  Resp: 16  Temp: 98.7 F (37.1 C)  SpO2: 99%   BP Readings from Last 3 Encounters:  02/02/19 (!) 164/72  01/29/18 120/68  01/29/17 (!) 178/90   Wt Readings from Last 3 Encounters:  02/02/19 135 lb 12.8 oz (61.6 kg)  01/29/18 134 lb (60.8 kg)  01/29/17 133 lb (60.3 kg)   Body mass index is 24.06 kg/m.   Physical Exam    Constitutional: Appears well-developed and well-nourished. No distress.  HENT:  Head: Normocephalic and atraumatic.  Neck: Neck supple. No tracheal deviation present. No thyromegaly present.  No cervical lymphadenopathy Cardiovascular: Normal rate, regular rhythm and normal heart sounds.   No murmur heard. No carotid bruit .  No edema Pulmonary/Chest: Effort normal and breath sounds normal. No respiratory distress. No has no wheezes. No rales.  Msk: right lateral-posterior sided neck pain  - mildly tender with palpation, FROM, posterior c-spine tenderness Skin: Skin is warm and dry. Not diaphoretic.  Psychiatric: Normal mood and affect. Behavior is normal.      Assessment & Plan:    See Problem List for Assessment and Plan of chronic medical problems.

## 2019-02-01 NOTE — Patient Instructions (Addendum)
Monitor your BP at home - goal is less than 140/90.  Tests ordered today. Your results will be released to [place] (or called to you) after review, usually within 72hours after test completion. If any changes need to be made, you will be notified at that same time.   Medications reviewed and updated.  Changes include :   none  Your prescription(s) have been submitted to your pharmacy. Please take as directed and contact our office if you believe you are having problem(s) with the medication(s).   Please followup in one year

## 2019-02-02 ENCOUNTER — Other Ambulatory Visit (INDEPENDENT_AMBULATORY_CARE_PROVIDER_SITE_OTHER): Payer: BLUE CROSS/BLUE SHIELD

## 2019-02-02 ENCOUNTER — Ambulatory Visit (INDEPENDENT_AMBULATORY_CARE_PROVIDER_SITE_OTHER): Payer: BLUE CROSS/BLUE SHIELD | Admitting: Internal Medicine

## 2019-02-02 ENCOUNTER — Encounter: Payer: Self-pay | Admitting: Internal Medicine

## 2019-02-02 VITALS — BP 164/72 | HR 60 | Temp 98.7°F | Resp 16 | Ht 63.0 in | Wt 135.8 lb

## 2019-02-02 DIAGNOSIS — R030 Elevated blood-pressure reading, without diagnosis of hypertension: Secondary | ICD-10-CM | POA: Diagnosis not present

## 2019-02-02 DIAGNOSIS — M810 Age-related osteoporosis without current pathological fracture: Secondary | ICD-10-CM | POA: Diagnosis not present

## 2019-02-02 DIAGNOSIS — F4321 Adjustment disorder with depressed mood: Secondary | ICD-10-CM | POA: Diagnosis not present

## 2019-02-02 DIAGNOSIS — E039 Hypothyroidism, unspecified: Secondary | ICD-10-CM | POA: Diagnosis not present

## 2019-02-02 LAB — CBC WITH DIFFERENTIAL/PLATELET
Basophils Absolute: 0 10*3/uL (ref 0.0–0.1)
Basophils Relative: 0.8 % (ref 0.0–3.0)
Eosinophils Absolute: 0.1 10*3/uL (ref 0.0–0.7)
Eosinophils Relative: 3 % (ref 0.0–5.0)
HCT: 36.5 % (ref 36.0–46.0)
Hemoglobin: 13.1 g/dL (ref 12.0–15.0)
Lymphocytes Relative: 27.5 % (ref 12.0–46.0)
Lymphs Abs: 1.1 10*3/uL (ref 0.7–4.0)
MCHC: 35.8 g/dL (ref 30.0–36.0)
MCV: 87.3 fl (ref 78.0–100.0)
Monocytes Absolute: 0.3 10*3/uL (ref 0.1–1.0)
Monocytes Relative: 6.1 % (ref 3.0–12.0)
NEUTROS ABS: 2.6 10*3/uL (ref 1.4–7.7)
Neutrophils Relative %: 62.6 % (ref 43.0–77.0)
Platelets: 192 10*3/uL (ref 150.0–400.0)
RBC: 4.18 Mil/uL (ref 3.87–5.11)
RDW: 12.9 % (ref 11.5–15.5)
WBC: 4.2 10*3/uL (ref 4.0–10.5)

## 2019-02-02 LAB — COMPREHENSIVE METABOLIC PANEL
ALBUMIN: 4.6 g/dL (ref 3.5–5.2)
ALT: 18 U/L (ref 0–35)
AST: 16 U/L (ref 0–37)
Alkaline Phosphatase: 55 U/L (ref 39–117)
BUN: 11 mg/dL (ref 6–23)
CO2: 30 mEq/L (ref 19–32)
Calcium: 9.4 mg/dL (ref 8.4–10.5)
Chloride: 104 mEq/L (ref 96–112)
Creatinine, Ser: 0.75 mg/dL (ref 0.40–1.20)
GFR: 77.61 mL/min (ref >60.00)
Glucose, Bld: 94 mg/dL (ref 70–99)
Potassium: 3.8 mEq/L (ref 3.5–5.1)
Sodium: 140 mEq/L (ref 135–145)
TOTAL PROTEIN: 6.8 g/dL (ref 6.0–8.3)
Total Bilirubin: 0.7 mg/dL (ref 0.2–1.2)

## 2019-02-02 LAB — TSH: TSH: 0.85 u[IU]/mL (ref 0.35–4.50)

## 2019-02-02 LAB — HEMOGLOBIN A1C: Hgb A1c MFr Bld: 4.8 % (ref 4.6–6.5)

## 2019-02-02 MED ORDER — ALPRAZOLAM 0.5 MG PO TABS: 0.5000 mg | ORAL_TABLET | Freq: Every evening | ORAL | 1 refills | Status: DC | PRN

## 2019-02-02 MED ORDER — LEVOTHYROXINE SODIUM 112 MCG PO TABS: ORAL_TABLET | ORAL | 1 refills | Status: DC

## 2019-02-02 NOTE — Assessment & Plan Note (Signed)
Advised to schedule dexa  Start regular exercise Was on fosamax in past but had joint pain

## 2019-02-02 NOTE — Assessment & Plan Note (Addendum)
BP typically well controlled at home Elevated here - will not start medication monitor at home consistently for the next few weeks - discussed goal - she will let me know if elevated Cbc, cmp

## 2019-02-02 NOTE — Assessment & Plan Note (Signed)
Clinically euthyroid Check tsh  Titrate med dose if needed  

## 2019-02-02 NOTE — Assessment & Plan Note (Signed)
Weaning off lexapro - overall doing well Still some sleep difficulties - will continue xanax as needed - discuss ideally it would be good to get off of this, but ok to continue for now

## 2019-02-05 ENCOUNTER — Encounter: Payer: Self-pay | Admitting: Internal Medicine

## 2019-03-01 ENCOUNTER — Telehealth: Payer: BLUE CROSS/BLUE SHIELD | Admitting: Nurse Practitioner

## 2019-03-01 DIAGNOSIS — R05 Cough: Secondary | ICD-10-CM

## 2019-03-01 DIAGNOSIS — R059 Cough, unspecified: Secondary | ICD-10-CM

## 2019-03-01 MED ORDER — BENZONATATE 100 MG PO CAPS: 100.0000 mg | ORAL_CAPSULE | Freq: Three times a day (TID) | ORAL | 0 refills | Status: DC | PRN

## 2019-03-01 NOTE — Progress Notes (Signed)
We are sorry that you are not feeling well.  Here is how we plan to help!  Based on your presentation I believe you most likely have A cough due to allergies.  I recommend that you start the an over-the counter-allergy medication such as Claritin 10 mg or Zyrtec 10 mg daily.     In addition you may use A non-prescription cough medication called Robitussin DAC. Take 2 teaspoons every 8 hours or Delsym: take 2 teaspoons every 12 hours.    From your responses in the eVisit questionnaire you describe inflammation in the upper respiratory tract which is causing a significant cough.  This is commonly called Bronchitis and has four common causes:    Allergies  Viral Infections  Acid Reflux  Bacterial Infection Allergies, viruses and acid reflux are treated by controlling symptoms or eliminating the cause. An example might be a cough caused by taking certain blood pressure medications. You stop the cough by changing the medication. Another example might be a cough caused by acid reflux. Controlling the reflux helps control the cough.  USE OF BRONCHODILATOR ("RESCUE") INHALERS: There is a risk from using your bronchodilator too frequently.  The risk is that over-reliance on a medication which only relaxes the muscles surrounding the breathing tubes can reduce the effectiveness of medications prescribed to reduce swelling and congestion of the tubes themselves.  Although you feel brief relief from the bronchodilator inhaler, your asthma may actually be worsening with the tubes becoming more swollen and filled with mucus.  This can delay other crucial treatments, such as oral steroid medications. If you need to use a bronchodilator inhaler daily, several times per day, you should discuss this with your provider.  There are probably better treatments that could be used to keep your asthma under control.     HOME CARE . Only take medications as instructed by your medical team. . Complete the entire course  of an antibiotic. . Drink plenty of fluids and get plenty of rest. . Avoid close contacts especially the very young and the elderly . Cover your mouth if you cough or cough into your sleeve. . Always remember to wash your hands . A steam or ultrasonic humidifier can help congestion.   GET HELP RIGHT AWAY IF: . You develop worsening fever. . You become short of breath . You cough up blood. . Your symptoms persist after you have completed your treatment plan MAKE SURE YOU   Understand these instructions.  Will watch your condition.  Will get help right away if you are not doing well or get worse.  Your e-visit answers were reviewed by a board certified advanced clinical practitioner to complete your personal care plan.  Depending on the condition, your plan could have included both over the counter or prescription medications. If there is a problem please reply  once you have received a response from your provider. Your safety is important to [place].  If you have drug allergies check your prescription carefully.    You can use MyChart to ask questions about today's visit, request a non-urgent call back, or ask for a work or school excuse for 24 hours related to this e-Visit. If it has been greater than 24 hours you will need to follow up with your provider, or enter a new e-Visit to address those concerns. You will get an e-mail in the next two days asking about your experience.  I hope that your e-visit has been valuable and will speed your recovery.  Thank you for using e-visits.  5 minutes spent reviewing and documenting in chart.

## 2019-03-01 NOTE — Progress Notes (Signed)
It is normal to have a feer with virus. Just treat symptoms and if worsens let us know.

## 2019-03-05 ENCOUNTER — Other Ambulatory Visit: Payer: Self-pay | Admitting: *Deleted

## 2019-03-05 MED ORDER — AMOXICILLIN 500 MG PO CAPS: 500.0000 mg | ORAL_CAPSULE | Freq: Three times a day (TID) | ORAL | 0 refills | Status: DC

## 2019-03-05 NOTE — Telephone Encounter (Signed)
Copied from [place] [id number]. Topic: Appointment Scheduling - Scheduling Inquiry for Clinic >> Mar 05, 2019  8:45 AM Margot Ables wrote: Reason for CRM: pt called stating that her left ear drum is now hurting, she has "juicy" sinuses, she has a sore throat, pt is coughing a lot, pt had a low fever Sunday but has not had one since then (see e-visit). No travel. No sob.  Pt requesting call back from Dr. Quay Burow or his nurse to advise. >> Mar 05, 2019  9:08 AM Para Skeans A wrote: Patient had an e-Visit on 3/29.

## 2019-03-05 NOTE — Telephone Encounter (Signed)
Pt informed of below  Verbalized understanding

## 2019-03-05 NOTE — Telephone Encounter (Signed)
Lets start an antibiotic for presumed bacterial infection.  Amoxicillin is pending-if she is okay with that sent to preferred pharmacy.

## 2019-04-17 ENCOUNTER — Telehealth: Payer: Self-pay | Admitting: Emergency Medicine

## 2019-04-17 NOTE — Telephone Encounter (Signed)
Patient scheduled Office Visit for "medication renewal, thyroid" via MyChart for [facility] 02/02/19. It was not documented or scheduled as a CPE. I sent patient a MyChart message explaining this.

## 2019-04-17 NOTE — Telephone Encounter (Signed)
Is this something you can look into?

## 2019-04-17 NOTE — Telephone Encounter (Signed)
Copied from [place] [id number]. Topic: General - Inquiry >> Apr 17, 2019  1:45 PM Vernona Rieger wrote: Reason for CRM: patient said her visit with Dr Quay Burow on 02/02/19 was suppose to have been coded as a CPE. She said she has talked to billing twice and it is not getting resolved. Please advise

## 2019-04-30 ENCOUNTER — Encounter: Payer: Self-pay | Admitting: Internal Medicine

## 2019-04-30 DIAGNOSIS — M810 Age-related osteoporosis without current pathological fracture: Secondary | ICD-10-CM

## 2019-05-01 ENCOUNTER — Other Ambulatory Visit: Payer: Self-pay | Admitting: Internal Medicine

## 2019-05-01 DIAGNOSIS — D242 Benign neoplasm of left breast: Secondary | ICD-10-CM

## 2019-05-25 ENCOUNTER — Other Ambulatory Visit: Payer: Self-pay

## 2019-05-25 ENCOUNTER — Ambulatory Visit
Admission: RE | Admit: 2019-05-25 | Discharge: 2019-05-25 | Disposition: A | Discharge status: Home / Self Care | Payer: BLUE CROSS/BLUE SHIELD | Source: Ambulatory Visit | Attending: Internal Medicine | Admitting: Internal Medicine

## 2019-05-25 ENCOUNTER — Ambulatory Visit
Admission: RE | Admit: 2019-05-25 | Discharge: 2019-05-25 | Disposition: A | Discharge status: Home / Self Care | Payer: Medicare Other | Source: Ambulatory Visit | Attending: Internal Medicine | Admitting: Internal Medicine

## 2019-05-25 DIAGNOSIS — D242 Benign neoplasm of left breast: Secondary | ICD-10-CM

## 2019-07-10 ENCOUNTER — Other Ambulatory Visit: Payer: Self-pay

## 2019-07-10 ENCOUNTER — Ambulatory Visit: Payer: Medicare Other | Admitting: *Deleted

## 2019-07-10 VITALS — Ht 63.0 in | Wt 133.0 lb

## 2019-07-10 DIAGNOSIS — Z1211 Encounter for screening for malignant neoplasm of colon: Secondary | ICD-10-CM

## 2019-07-10 MED ORDER — SUPREP BOWEL PREP KIT 17.5-3.13-1.6 GM/177ML PO SOLN: 1.0000 | Freq: Once | ORAL | 0 refills | Status: AC

## 2019-07-10 NOTE — Progress Notes (Signed)
No egg or soy allergy known to patient  No issues with past sedation with any surgeries  or procedures, no intubation problems - pt states hard to wake post op  No diet pills per patient No home 02 use per patient  No blood thinners per patient  Pt denies issues with constipation  No A fib or A flutter  EMMI video sent to pt's e mail   Pt verified name, DOB, address and insurance during PV today. Pt mailed instruction packet to included paper to complete and mail back to [facility] with addressed and stamped envelope, Emmi video, copy of consent form to read and not return, and instructions. Suprep $15  coupon mailed in packet. PV completed over the phone. Pt encouraged to call with questions or issues   Pt is aware that care partner will wait in the car during procedure; if they feel like they will be too hot to wait in the car; they may wait in the lobby.  We want them to wear a mask (we do not have any that we can provide them), practice social distancing, and we will check their temperatures when they get here.  I did remind patient that their care partner needs to stay in the parking lot the entire time. Pt will wear mask into building.

## 2019-07-20 ENCOUNTER — Ambulatory Visit
Admission: RE | Admit: 2019-07-20 | Discharge: 2019-07-20 | Disposition: A | Discharge status: Home / Self Care | Payer: Medicare Other | Source: Ambulatory Visit | Attending: Internal Medicine | Admitting: Internal Medicine

## 2019-07-20 ENCOUNTER — Other Ambulatory Visit: Payer: Self-pay

## 2019-07-20 DIAGNOSIS — M810 Age-related osteoporosis without current pathological fracture: Secondary | ICD-10-CM

## 2019-07-22 ENCOUNTER — Encounter: Payer: Self-pay | Admitting: Internal Medicine

## 2019-07-27 ENCOUNTER — Encounter: Payer: Self-pay | Admitting: Gastroenterology

## 2019-07-29 ENCOUNTER — Other Ambulatory Visit: Payer: Self-pay | Admitting: Internal Medicine

## 2019-07-29 NOTE — Telephone Encounter (Signed)
Last OV 02/02/19 Next OV NA Last RF 05/05/19

## 2019-07-30 ENCOUNTER — Telehealth: Payer: Self-pay

## 2019-07-30 NOTE — Telephone Encounter (Signed)
Pt responded "no" to all screening questions °

## 2019-07-30 NOTE — Telephone Encounter (Signed)
Covid-19 screening questions   Do you now or have you had a fever in the last 14 days?  Do you have any respiratory symptoms of shortness of breath or cough now or in the last 14 days?  Do you have any family members or close contacts with diagnosed or suspected Covid-19 in the past 14 days?  Have you been tested for Covid-19 and found to be positive?       

## 2019-07-31 ENCOUNTER — Encounter: Payer: Self-pay | Admitting: Gastroenterology

## 2019-07-31 ENCOUNTER — Other Ambulatory Visit: Payer: Self-pay

## 2019-07-31 ENCOUNTER — Ambulatory Visit (AMBULATORY_SURGERY_CENTER): Payer: Medicare Other | Admitting: Gastroenterology

## 2019-07-31 VITALS — BP 118/42 | HR 52 | Temp 98.9°F | Resp 14 | Ht 63.0 in | Wt 133.0 lb

## 2019-07-31 DIAGNOSIS — Z1211 Encounter for screening for malignant neoplasm of colon: Secondary | ICD-10-CM

## 2019-07-31 MED ORDER — SODIUM CHLORIDE 0.9 % IV SOLN: 500.0000 mL | Freq: Once | INTRAVENOUS | Status: DC

## 2019-07-31 NOTE — Op Note (Signed)
[place] Patient Name: Linda Gay Procedure Date: 07/31/2019 11:14 AM MRN: [id number] Endoscopist: Remo Lipps P. Havery Moros , MD Age: 63 Referring MD:  Date of Birth: [birth year] Gender: Female Account #: [id number] Procedure:                Colonoscopy Indications:              Screening for colorectal malignant neoplasm Medicines:                Monitored Anesthesia Care Procedure:                Pre-Anesthesia Assessment:                           - Prior to the procedure, a History and Physical                            was performed, and patient medications and                            allergies were reviewed. The patient's tolerance of                            previous anesthesia was also reviewed. The risks                            and benefits of the procedure and the sedation                            options and risks were discussed with the patient.                            All questions were answered, and informed consent                            was obtained. Prior Anticoagulants: The patient has                            taken no previous anticoagulant or antiplatelet                            agents. ASA Grade Assessment: II - A patient with                            mild systemic disease. After reviewing the risks                            and benefits, the patient was deemed in                            satisfactory condition to undergo the procedure.                           After obtaining informed consent, the colonoscope  was passed under direct vision. Throughout the                            procedure, the patient's blood pressure, pulse, and                            oxygen saturations were monitored continuously. The                            Colonoscope was introduced through the anus and                            advanced to the the cecum, identified by                            appendiceal orifice and  ileocecal valve. The                            colonoscopy was performed without difficulty. The                            patient tolerated the procedure well. The quality                            of the bowel preparation was good. The ileocecal                            valve, appendiceal orifice, and rectum were                            photographed. Scope In: 11:18:51 AM Scope Out: 11:40:44 AM Scope Withdrawal Time: 0 hours 18 minutes 39 seconds  Total Procedure Duration: 0 hours 21 minutes 53 seconds  Findings:                 The perianal and digital rectal examinations were                            normal.                           A few small-mouthed diverticula were found in the                            sigmoid colon.                           Anal papilla(e) were hypertrophied.                           The exam was otherwise without abnormality. Complications:            No immediate complications. Estimated blood loss:                            Minimal. Estimated Blood Loss:     Estimated blood loss  was minimal. Impression:               - Diverticulosis in the sigmoid colon.                           - Anal papilla(e) were hypertrophied.                           - The examination was otherwise normal.                           - No specimens collected. Recommendation:           - Patient has a contact number available for                            emergencies. The signs and symptoms of potential                            delayed complications were discussed with the                            patient. Return to normal activities tomorrow.                            Written discharge instructions were provided to the                            patient.                           - Resume previous diet.                           - Continue present medications.                           - Repeat colonoscopy in 10 years for screening                             purposes. Remo Lipps P. Francile Woolford, MD 07/31/2019 11:45:25 AM This report has been signed electronically.

## 2019-07-31 NOTE — Progress Notes (Signed)
PT taken to PACU. Monitors in place. VSS. Report given to RN. 

## 2019-07-31 NOTE — Progress Notes (Signed)
Pt's states no medical or surgical changes since previsit or office visit.Pt's states no medical or surgical changes since previsit or office visit.  Temp taken by AR VS taken by CW

## 2019-07-31 NOTE — Patient Instructions (Signed)
Handout given for diverticulosis.  You don't need another colonoscopy for 10 years!  YOU HAD AN ENDOSCOPIC PROCEDURE TODAY AT [facility]:   Refer to the procedure report that was given to you for any specific questions about what was found during the examination.  If the procedure report does not answer your questions, please call your gastroenterologist to clarify.  If you requested that your care partner not be given the details of your procedure findings, then the procedure report has been included in a sealed envelope for you to review at your convenience later.  YOU SHOULD EXPECT: Some feelings of bloating in the abdomen. Passage of more gas than usual.  Walking can help get rid of the air that was put into your GI tract during the procedure and reduce the bloating. If you had a lower endoscopy (such as a colonoscopy or flexible sigmoidoscopy) you may notice spotting of blood in your stool or on the toilet paper. If you underwent a bowel prep for your procedure, you may not have a normal bowel movement for a few days.  Please Note:  You might notice some irritation and congestion in your nose or some drainage.  This is from the oxygen used during your procedure.  There is no need for concern and it should clear up in a day or so.  SYMPTOMS TO REPORT IMMEDIATELY:   Following lower endoscopy (colonoscopy or flexible sigmoidoscopy):  Excessive amounts of blood in the stool  Significant tenderness or worsening of abdominal pains  Swelling of the abdomen that is new, acute  Fever of 100F or higher   For urgent or emergent issues, a gastroenterologist can be reached at any hour by calling [phone].   DIET:  We do recommend a small meal at first, but then you may proceed to your regular diet.  Drink plenty of fluids but you should avoid alcoholic beverages for 24 hours.  ACTIVITY:  You should plan to take it easy for the rest of today and you should NOT DRIVE or use  heavy machinery until tomorrow (because of the sedation medicines used during the test).    FOLLOW UP: Our staff will call the number listed on your records 48-72 hours following your procedure to check on you and address any questions or concerns that you may have regarding the information given to you following your procedure. If we do not reach you, we will leave a message.  We will attempt to reach you two times.  During this call, we will ask if you have developed any symptoms of COVID 19. If you develop any symptoms (ie: fever, flu-like symptoms, shortness of breath, cough etc.) before then, please call [phone].  If you test positive for Covid 19 in the 2 weeks post procedure, please call and report this information to [place].    If any biopsies were taken you will be contacted by phone or by letter within the next 1-3 weeks.  Please call us at [phone] if you have not heard about the biopsies in 3 weeks.    SIGNATURES/CONFIDENTIALITY: You and/or your care partner have signed paperwork which will be entered into your electronic medical record.  These signatures attest to the fact that that the information above on your After Visit Summary has been reviewed and is understood.  Full responsibility of the confidentiality of this discharge information lies with you and/or your care-partner.

## 2019-08-04 ENCOUNTER — Telehealth: Payer: Self-pay

## 2019-08-04 NOTE — Telephone Encounter (Signed)
  Follow up Call-  Call back number 07/31/2019  Post procedure Call Back phone  # [phone]  Permission to leave phone message Yes  Some recent data might be hidden     Patient questions:  Do you have a fever, pain , or abdominal swelling? No. Pain Score  0 *  Have you tolerated food without any problems? Yes.    Have you been able to return to your normal activities? Yes.    Do you have any questions about your discharge instructions: Diet   No. Medications  No. Follow up visit  No.  Do you have questions or concerns about your Care? No.  Actions: * If pain score is 4 or above: No action needed, pain <4. 1. Have you developed a fever since your procedure? no  2.   Have you had an respiratory symptoms (SOB or cough) since your procedure? no  3.   Have you tested positive for COVID 19 since your procedure no  4.   Have you had any family members/close contacts diagnosed with the COVID 19 since your procedure?  no   If yes to any of these questions please route to Joylene John, RN and Alphonsa Gin, Therapist, sports.

## 2019-12-08 ENCOUNTER — Telehealth: Payer: Self-pay | Admitting: Internal Medicine

## 2019-12-08 MED ORDER — BENZONATATE 200 MG PO CAPS: 200.0000 mg | ORAL_CAPSULE | Freq: Three times a day (TID) | ORAL | 0 refills | Status: DC | PRN

## 2019-12-08 MED ORDER — PROMETHAZINE-DM 6.25-15 MG/5ML PO SYRP: 5.0000 mL | ORAL_SOLUTION | Freq: Four times a day (QID) | ORAL | 0 refills | Status: DC | PRN

## 2019-12-08 NOTE — Telephone Encounter (Signed)
Pt would like something that does not make her drowsy.

## 2019-12-08 NOTE — Telephone Encounter (Signed)
I am assuming she is requesting cough medication.  Sent to [facility].

## 2019-12-08 NOTE — Telephone Encounter (Signed)
Copied from [place] [phone]. Topic: General - Other >> [place] 5, 2021 10:54 AM Celene Kras wrote: Reason for CRM: Pt called stating she was diagnosed with covid last week. Pt states she has a cough and is requesting to know if she can a medication sent in for her. Please advise.   [facility] [id number], [address] AT [facility] [address] [place] [address] Phone: [phone] Fax: [phone] Not a 24 hour pharmacy; exact hours not known.

## 2020-01-04 ENCOUNTER — Other Ambulatory Visit: Payer: Self-pay

## 2020-01-06 ENCOUNTER — Encounter: Payer: Self-pay | Admitting: Internal Medicine

## 2020-01-06 ENCOUNTER — Other Ambulatory Visit: Payer: Self-pay

## 2020-01-06 ENCOUNTER — Ambulatory Visit (INDEPENDENT_AMBULATORY_CARE_PROVIDER_SITE_OTHER): Payer: Medicare Other | Admitting: Internal Medicine

## 2020-01-06 VITALS — BP 140/69 | HR 62 | Temp 97.0°F | Resp 16 | Ht 63.0 in | Wt 136.1 lb

## 2020-01-06 DIAGNOSIS — F4321 Adjustment disorder with depressed mood: Secondary | ICD-10-CM | POA: Diagnosis not present

## 2020-01-06 DIAGNOSIS — E039 Hypothyroidism, unspecified: Secondary | ICD-10-CM

## 2020-01-06 DIAGNOSIS — Z79899 Other long term (current) drug therapy: Secondary | ICD-10-CM | POA: Diagnosis not present

## 2020-01-06 DIAGNOSIS — R030 Elevated blood-pressure reading, without diagnosis of hypertension: Secondary | ICD-10-CM

## 2020-01-06 DIAGNOSIS — G4700 Insomnia, unspecified: Secondary | ICD-10-CM

## 2020-01-06 DIAGNOSIS — M810 Age-related osteoporosis without current pathological fracture: Secondary | ICD-10-CM

## 2020-01-06 NOTE — Patient Instructions (Addendum)
GO TO THE LAB : Provide a urine sample   GO TO THE FRONT DESK Come back for a physical exam in 4 to 5 months, please make an appointment  Come back for blood work only at your earliest convenience, please make an appointment    Check the  blood pressure monthly BP GOAL is between 110/65 and  135/85. If it is consistently higher or lower, let me know

## 2020-01-06 NOTE — Progress Notes (Signed)
   Subjective:    Patient ID: Linda Gay, female    DOB: [birth year], 64 y.o.   MRN: [id number]  DOS:  01/06/2020 Type of visit - description: new pt, transfer  Today we discussed multiple issues including elevated BP, grieving, osteoporosis, preventive care.    Review of Systems In general she is doing well, still feels that has not progressed on her grieving from losing her husband as much as see would like. She remains active physically, has a job, eats healthy.  Past Medical History:  Diagnosis Date  . Osteoporosis   . Thyroid disease     Past Surgical History:  Procedure Laterality Date  . BREAST BIOPSY Left 01/30/2016  . COLONOSCOPY     12 yrs ago in [facility]  . HEMORRHOID SURGERY    . TONSILLECTOMY          Objective:   Physical Exam BP 140/69 (BP Location: Left Arm, Patient Position: Sitting, Cuff Size: Small)   Pulse 62   Temp (!) 97 F (36.1 C) (Temporal)   Resp 16   Ht 5\' 3"  (1.6 m)   Wt 136 lb 2 oz (61.7 kg)   SpO2 100%   BMI 24.11 kg/m  General:   Well developed, NAD, BMI noted. HEENT:  Normocephalic . Face symmetric, atraumatic Neck: No thyromegaly Lungs:  CTA B Normal respiratory effort, no intercostal retractions, no accessory muscle use. Heart: RRR,  no murmur.  No pretibial edema bilaterally  Skin: Not pale. Not jaundice Neurologic:  alert & oriented X3.  Speech normal, gait appropriate for age and unassisted Psych--  Cognition and judgment appear intact.  Cooperative with normal attention span and concentration.  Behavior appropriate. No anxious or depressed appearing.      Assessment    Assessment Hypothyroidism Osteoporosis Elevated BP  PLAN New patient Hypothyroidism: Good med compliance, check a TSH Osteoporosis: Last bone density test 8-20 20, T score -2.6. Was prescribed Reclast in 2016 but she never pursued.  Subsequently, she took Fosamax for approximately 18 months and stopped it due to aches and pains.  Encouraged to  remain active, reassess the issue on RTC Elevated BP: BP is good today, will check a BMP and FLP Grieving: Lost her husband approximately 03-2017, took Lexapro temporarily, self stopped 12-2018. Although she is not depressed or tearful she feels that she would be a little better by now. She just took grieving class at church. Counseled:  overall I think she is doing okay,  encouraged to establish with one of our counselors. Insomnia: Takes Xanax, UDS and contract today Preventive care: Data reviewed, see below, CPX on RTC Td 2018 PNM 23:  2020 Zoster 2017 Had a flu shot  CCS: 07/2019 next per GI PAP 2019 MMG 2020 RTC 4 to 5 months   This visit occurred during the SARS-CoV-2 public health emergency.  Safety protocols were in place, including screening questions prior to the visit, additional usage of staff PPE, and extensive cleaning of exam room while observing appropriate contact time as indicated for disinfecting solutions.

## 2020-01-06 NOTE — Progress Notes (Signed)
Pre visit review using our clinic review tool, if applicable. No additional management support is needed unless otherwise documented below in the visit note. 

## 2020-01-07 ENCOUNTER — Other Ambulatory Visit (INDEPENDENT_AMBULATORY_CARE_PROVIDER_SITE_OTHER): Payer: Medicare Other

## 2020-01-07 DIAGNOSIS — R030 Elevated blood-pressure reading, without diagnosis of hypertension: Secondary | ICD-10-CM | POA: Diagnosis not present

## 2020-01-07 DIAGNOSIS — G4700 Insomnia, unspecified: Secondary | ICD-10-CM | POA: Insufficient documentation

## 2020-01-07 DIAGNOSIS — E039 Hypothyroidism, unspecified: Secondary | ICD-10-CM | POA: Diagnosis not present

## 2020-01-07 DIAGNOSIS — Z09 Encounter for follow-up examination after completed treatment for conditions other than malignant neoplasm: Secondary | ICD-10-CM | POA: Insufficient documentation

## 2020-01-07 LAB — LIPID PANEL
Cholesterol: 231 mg/dL — ABNORMAL HIGH (ref 0–200)
HDL: 83.4 mg/dL (ref >39.00)
LDL Cholesterol: 136 mg/dL — ABNORMAL HIGH (ref 0–99)
NonHDL: 147.97
Total CHOL/HDL Ratio: 3
Triglycerides: 60 mg/dL (ref 0.0–149.0)
VLDL: 12 mg/dL (ref 0.0–40.0)

## 2020-01-07 LAB — BASIC METABOLIC PANEL
BUN: 20 mg/dL (ref 6–23)
CO2: 27 mEq/L (ref 19–32)
Calcium: 10 mg/dL (ref 8.4–10.5)
Chloride: 99 mEq/L (ref 96–112)
Creatinine, Ser: 0.73 mg/dL (ref 0.40–1.20)
GFR: 79.84 mL/min (ref >60.00)
Glucose, Bld: 90 mg/dL (ref 70–99)
Potassium: 4 mEq/L (ref 3.5–5.1)
Sodium: 136 mEq/L (ref 135–145)

## 2020-01-07 LAB — TSH: TSH: 1.52 u[IU]/mL (ref 0.35–4.50)

## 2020-01-07 NOTE — Assessment & Plan Note (Signed)
New patient Hypothyroidism: Good med compliance, check a TSH Osteoporosis: Last bone density test 8-20 20, T score -2.6. Was prescribed Reclast in 2016 but she never pursued.  Subsequently, she took Fosamax for approximately 18 months and stopped it due to aches and pains.  Encouraged to remain active, reassess the issue on RTC Elevated BP: BP is good today, will check a BMP and FLP Grieving: Lost her husband approximately 03-2017, took Lexapro temporarily, self stopped 12-2018. Although she is not depressed or tearful she feels that she would be a little better by now. She just took grieving class at church. Counseled:  overall I think she is doing okay,  encouraged to establish with one of our counselors. Insomnia: Takes Xanax, UDS and contract today Preventive care: Data reviewed, see below, CPX on RTC Td 2018 PNM 23:  2020 Zoster 2017 Had a flu shot  CCS: 07/2019 next per GI PAP 2019 MMG 2020 RTC 4 to 5 months

## 2020-01-08 LAB — PAIN MGMT, PROFILE 8 W/CONF, U
6 Acetylmorphine: NEGATIVE ng/mL
Alcohol Metabolites: NEGATIVE ng/mL (ref <500)
Amphetamines: NEGATIVE ng/mL
Benzodiazepines: NEGATIVE ng/mL
Buprenorphine, Urine: NEGATIVE ng/mL
Cocaine Metabolite: NEGATIVE ng/mL
Creatinine: 22.8 mg/dL
MDMA: NEGATIVE ng/mL
Marijuana Metabolite: NEGATIVE ng/mL
Opiates: NEGATIVE ng/mL
Oxidant: NEGATIVE ug/mL
Oxycodone: NEGATIVE ng/mL
pH: 5.7 (ref 4.5–9.0)

## 2020-02-04 ENCOUNTER — Telehealth: Payer: Self-pay

## 2020-02-04 MED ORDER — LEVOTHYROXINE SODIUM 112 MCG PO TABS: 112.0000 ug | ORAL_TABLET | Freq: Every day | ORAL | 1 refills | Status: DC

## 2020-02-04 MED ORDER — ALPRAZOLAM 0.5 MG PO TABS: 0.5000 mg | ORAL_TABLET | Freq: Every evening | ORAL | 2 refills | Status: DC | PRN

## 2020-02-04 NOTE — Telephone Encounter (Signed)
Sent!

## 2020-02-04 NOTE — Telephone Encounter (Signed)
Alprazolam refill.   Last OV: 01/06/2020 Last Fill: 07/30/2019 #30 and 2RF Pt sig: 1 tab qhs prn UDS: 01/07/2020 Low risk

## 2020-02-05 ENCOUNTER — Ambulatory Visit: Payer: Medicare Other | Attending: Internal Medicine

## 2020-02-05 DIAGNOSIS — Z23 Encounter for immunization: Secondary | ICD-10-CM | POA: Insufficient documentation

## 2020-02-05 NOTE — Progress Notes (Signed)
   Covid-19 Vaccination Clinic  Name:  Linda Gay    MRN: [id number] DOB: 1956/03/27  02/05/2020  [name] was observed post Covid-19 immunization for 15 minutes without incident. She was provided with Vaccine Information Sheet and instruction to access the V-Safe system.   [name] was instructed to call 911 with any severe reactions post vaccine: Marland Kitchen Difficulty breathing  . Swelling of face and throat  . A fast heartbeat  . A bad rash all over body  . Dizziness and weakness

## 2020-03-02 ENCOUNTER — Ambulatory Visit: Payer: Medicare Other | Attending: Internal Medicine

## 2020-03-02 DIAGNOSIS — Z23 Encounter for immunization: Secondary | ICD-10-CM

## 2020-03-02 NOTE — Progress Notes (Signed)
   Covid-19 Vaccination Clinic  Name:  NATAJIA SUDDRETH    MRN: [id number] DOB: 09-Jun-1956  03/02/2020  Ms. Privitera was observed post Covid-19 immunization for 15 minutes without incident. She was provided with Vaccine Information Sheet and instruction to access the V-Safe system.   Ms. Lamport was instructed to call 911 with any severe reactions post vaccine: Marland Kitchen Difficulty breathing  . Swelling of face and throat  . A fast heartbeat  . A bad rash all over body  . Dizziness and weakness   Immunizations Administered    Name Date Dose VIS Date Route   Pfizer COVID-19 Vaccine 03/02/2020  2:34 PM 0.3 mL 11/13/2019 Intramuscular   Manufacturer: [place]   Lot: [id number]   [place]: [id number]

## 2020-05-10 ENCOUNTER — Ambulatory Visit: Payer: Medicare Other | Admitting: Internal Medicine

## 2020-05-13 ENCOUNTER — Ambulatory Visit: Payer: Medicare Other | Admitting: Internal Medicine

## 2020-05-13 ENCOUNTER — Other Ambulatory Visit: Payer: Self-pay

## 2020-05-13 ENCOUNTER — Encounter: Payer: Self-pay | Admitting: Internal Medicine

## 2020-05-13 VITALS — BP 132/70 | HR 62 | Temp 97.8°F | Resp 18 | Ht 63.0 in | Wt 126.5 lb

## 2020-05-13 DIAGNOSIS — F4321 Adjustment disorder with depressed mood: Secondary | ICD-10-CM

## 2020-05-13 DIAGNOSIS — G4700 Insomnia, unspecified: Secondary | ICD-10-CM

## 2020-05-13 DIAGNOSIS — Z79899 Other long term (current) drug therapy: Secondary | ICD-10-CM

## 2020-05-13 DIAGNOSIS — M810 Age-related osteoporosis without current pathological fracture: Secondary | ICD-10-CM

## 2020-05-13 DIAGNOSIS — E039 Hypothyroidism, unspecified: Secondary | ICD-10-CM

## 2020-05-13 NOTE — Assessment & Plan Note (Signed)
Td 2018 PNM 23:  2020 Zoster 2017 Had a covid shot Consider Shingrix Recommend flu shot annually CCS: 07/2019 next per GI PAP remote abnormal PAP x1, then all wnl, last 2019, reassess on RTC MMG 05/2019.

## 2020-05-13 NOTE — Progress Notes (Signed)
   Subjective:    Patient ID: Linda Gay, female    DOB: 16-Sep-1956, 64 y.o.   MRN: [id number]  DOS:  05/13/2020 Type of visit - description: Follow-up Since the last office visit she is doing well. We talk about osteoporosis, grieving, insomnia.   Review of Systems See above   Past Medical History:  Diagnosis Date  . Osteoporosis   . Thyroid disease     Past Surgical History:  Procedure Laterality Date  . BREAST BIOPSY Left 01/30/2016  . COLONOSCOPY     12 yrs ago in [facility]  . HEMORRHOID SURGERY    . TONSILLECTOMY      Allergies as of 05/13/2020   No Known Allergies     Medication List       Accurate as of May 13, 2020 11:59 PM. If you have any questions, ask your nurse or doctor.        ALPRAZolam 0.5 MG tablet Commonly known as: XANAX Take 1 tablet (0.5 mg total) by mouth at bedtime as needed for anxiety.   levothyroxine 112 MCG tablet Commonly known as: SYNTHROID Take 1 tablet (112 mcg total) by mouth daily.          Objective:   Physical Exam BP 132/70 (BP Location: Left Arm, Patient Position: Sitting, Cuff Size: Small)   Pulse 62   Temp 97.8 F (36.6 C) (Temporal)   Resp 18   Ht 5\' 3"  (1.6 m)   Wt 126 lb 8 oz (57.4 kg)   SpO2 99%   BMI 22.41 kg/m  General:   Well developed, NAD, BMI noted.  HEENT:  Normocephalic . Face symmetric, atraumatic Neck: No thyromegaly Lungs:  CTA B Normal respiratory effort, no intercostal retractions, no accessory muscle use. Heart: RRR,  no murmur.  Abdomen:  Not distended, soft, non-tender. No rebound or rigidity.  No bruit. Skin: Not pale. Not jaundice Lower extremities: no pretibial edema bilaterally  Neurologic:  alert & oriented X3.  Speech normal, gait appropriate for age and unassisted Psych--  Cognition and judgment appear intact.  Cooperative with normal attention span and concentration.  Behavior appropriate. No anxious or depressed appearing.     Assessment      Assessment Hypothyroidism Osteoporosis:  07-2019 T score -2.6. s/p fosamax x ~ 18 months, self d/c d/t aches  Insomnia Elevated BP  PLAN Hypothyroidism: Good compliance with medication, last TSH satisfactory Osteoporosis: Encourage vitamin D supplements, intolerant to calcium due to constipation.  We talk about possibly other medications but we agreed to recheck a DEXA around 07-2021 and take it from there. Elevated BP: Ambulatory BPs less than 140, she checks regularly.  Continue checking. Grieving: Since the last office visit she is doing okay, she decided that no further counseling is needed.  Her grieving process is not finished but she feels well. Insomnia: Xanax helps, she is concerned about long-term problems, advised patient about the risk of abuse/addiction (in my opinion she is low risk).  We agreed to continue low-dose Xanax prn;  sleep tips provided. Preventive care reviewed  RTC 6 months     This visit occurred during the SARS-CoV-2 public health emergency.  Safety protocols were in place, including screening questions prior to the visit, additional usage of staff PPE, and extensive cleaning of exam room while observing appropriate contact time as indicated for disinfecting solutions.

## 2020-05-13 NOTE — Patient Instructions (Addendum)
Please schedule Medicare Wellness with Glenard Haring.    HEALTHY SLEEP Sleep hygiene: Basic rules for a good night's sleep  Sleep only as much as you need to feel rested and then get out of bed  Keep a regular sleep schedule  Avoid forcing sleep  Exercise regularly for at least 20 minutes, preferably 4 to 5 hours before bedtime  Avoid caffeinated beverages after lunch  Avoid alcohol near bedtime: no "night cap"  Avoid smoking, especially in the evening  Do not go to bed hungry  Adjust bedroom environment  Avoid prolonged use of light-emitting screens before bedtime   Deal with your worries before bedtime     RECCOMENDATIONS -Postmenopausal women:     800 IU (20 micrograms) vitamin D daily    Foods with Calcium FOOD Calcium Amount  1 cup of milk 300 mg  1 cup of yogurt 450 mg  1 oz cheese 200 mg  1 cup of spinach 240 mg  8 oz orange juice, fortified with calcium 300 mg    Check the  blood pressure   BP GOAL is between 110/65 and  135/85. If it is consistently higher or lower, let me know   GO TO THE FRONT DESK, PLEASE SCHEDULE YOUR APPOINTMENTS Come back for a check up in 6 months

## 2020-05-13 NOTE — Progress Notes (Signed)
Pre visit review using our clinic review tool, if applicable. No additional management support is needed unless otherwise documented below in the visit note. 

## 2020-05-15 NOTE — Assessment & Plan Note (Signed)
  Hypothyroidism: Good compliance with medication, last TSH satisfactory Osteoporosis: Encourage vitamin D supplements, intolerant to calcium due to constipation.  We talk about possibly other medications but we agreed to recheck a DEXA around 07-2021 and take it from there. Elevated BP: Ambulatory BPs less than 140, she checks regularly.  Continue checking. Grieving: Since the last office visit she is doing okay, she decided that no further counseling is needed.  Her grieving process is not finished but she feels well. Insomnia: Xanax helps, she is concerned about long-term problems, advised patient about the risk of abuse/addiction (in my opinion she is low risk).  We agreed to continue low-dose Xanax prn;  sleep tips provided. Preventive care reviewed  RTC 6 months

## 2020-06-16 ENCOUNTER — Telehealth: Payer: Self-pay | Admitting: Internal Medicine

## 2020-06-17 NOTE — Telephone Encounter (Signed)
PDMP ok, RF sent  ?

## 2020-06-17 NOTE — Telephone Encounter (Signed)
Alprazolam refill.   Last OV: 05/13/2020 Last Fill: 02/04/2020 #30 and 2RF Pt sig: 1 tab qhs prn UDS: 01/07/2020 Low risk

## 2020-07-28 ENCOUNTER — Other Ambulatory Visit: Payer: Self-pay | Admitting: Internal Medicine

## 2020-11-04 ENCOUNTER — Other Ambulatory Visit: Payer: Self-pay | Admitting: Internal Medicine

## 2020-11-04 DIAGNOSIS — Z1231 Encounter for screening mammogram for malignant neoplasm of breast: Secondary | ICD-10-CM

## 2020-11-11 ENCOUNTER — Encounter: Payer: Self-pay | Admitting: Internal Medicine

## 2020-11-11 ENCOUNTER — Ambulatory Visit (INDEPENDENT_AMBULATORY_CARE_PROVIDER_SITE_OTHER): Payer: Medicare Other | Admitting: Internal Medicine

## 2020-11-11 ENCOUNTER — Other Ambulatory Visit: Payer: Self-pay

## 2020-11-11 VITALS — BP 160/69 | HR 62 | Temp 98.1°F | Ht 63.0 in | Wt 129.6 lb

## 2020-11-11 DIAGNOSIS — I10 Essential (primary) hypertension: Secondary | ICD-10-CM | POA: Diagnosis not present

## 2020-11-11 DIAGNOSIS — Z23 Encounter for immunization: Secondary | ICD-10-CM

## 2020-11-11 DIAGNOSIS — M1991 Primary osteoarthritis, unspecified site: Secondary | ICD-10-CM

## 2020-11-11 DIAGNOSIS — E039 Hypothyroidism, unspecified: Secondary | ICD-10-CM | POA: Diagnosis not present

## 2020-11-11 LAB — CBC WITH DIFFERENTIAL/PLATELET
Basophils Absolute: 0 10*3/uL (ref 0.0–0.1)
Basophils Relative: 0.7 % (ref 0.0–3.0)
Eosinophils Absolute: 0.1 10*3/uL (ref 0.0–0.7)
Eosinophils Relative: 2.7 % (ref 0.0–5.0)
HCT: 39.4 % (ref 36.0–46.0)
Hemoglobin: 13.5 g/dL (ref 12.0–15.0)
Lymphocytes Relative: 22.6 % (ref 12.0–46.0)
Lymphs Abs: 0.9 10*3/uL (ref 0.7–4.0)
MCHC: 34.4 g/dL (ref 30.0–36.0)
MCV: 88.6 fl (ref 78.0–100.0)
Monocytes Absolute: 0.3 10*3/uL (ref 0.1–1.0)
Monocytes Relative: 7 % (ref 3.0–12.0)
Neutro Abs: 2.8 10*3/uL (ref 1.4–7.7)
Neutrophils Relative %: 67 % (ref 43.0–77.0)
Platelets: 174 10*3/uL (ref 150.0–400.0)
RBC: 4.45 Mil/uL (ref 3.87–5.11)
RDW: 13.2 % (ref 11.5–15.5)
WBC: 4.1 10*3/uL (ref 4.0–10.5)

## 2020-11-11 LAB — COMPREHENSIVE METABOLIC PANEL
ALT: 15 U/L (ref 0–35)
AST: 15 U/L (ref 0–37)
Albumin: 4.6 g/dL (ref 3.5–5.2)
Alkaline Phosphatase: 52 U/L (ref 39–117)
BUN: 13 mg/dL (ref 6–23)
CO2: 28 mEq/L (ref 19–32)
Calcium: 9.6 mg/dL (ref 8.4–10.5)
Chloride: 101 mEq/L (ref 96–112)
Creatinine, Ser: 0.71 mg/dL (ref 0.40–1.20)
GFR: 88.53 mL/min (ref >60.00)
Glucose, Bld: 106 mg/dL — ABNORMAL HIGH (ref 70–99)
Potassium: 4.1 mEq/L (ref 3.5–5.1)
Sodium: 138 mEq/L (ref 135–145)
Total Bilirubin: 0.9 mg/dL (ref 0.2–1.2)
Total Protein: 7 g/dL (ref 6.0–8.3)

## 2020-11-11 LAB — TSH: TSH: 1.63 u[IU]/mL (ref 0.35–4.50)

## 2020-11-11 MED ORDER — AMLODIPINE BESYLATE 5 MG PO TABS: 5.0000 mg | ORAL_TABLET | Freq: Every day | ORAL | 1 refills | Status: DC

## 2020-11-11 NOTE — Progress Notes (Signed)
Subjective:    Patient ID: Linda Gay, female    DOB: May 14, 1956, 64 y.o.   MRN: [id number]  DOS:  11/11/2020 Type of visit - description: Routine checkup Since the last office visit, her BP has been elevated. She remains active, room for improvement on her diet.  Also, some mornings wake up has mild back pain without radiation to the lower extremities. She is concerned about this being DJD or other type of arthritis. Denies any hand or wrist swelling. No fever chills.  No weight loss.   Review of Systems See above   Past Medical History:  Diagnosis Date  . Osteoporosis   . Thyroid disease     Past Surgical History:  Procedure Laterality Date  . BREAST BIOPSY Left 01/30/2016  . COLONOSCOPY     12 yrs ago in [facility]  . HEMORRHOID SURGERY    . TONSILLECTOMY      Allergies as of 11/11/2020   No Known Allergies     Medication List       Accurate as of November 11, 2020 11:59 PM. If you have any questions, ask your nurse or doctor.        ALPRAZolam 0.5 MG tablet Commonly known as: XANAX TAKE 1 TABLET(0.5 MG) BY MOUTH AT BEDTIME AS NEEDED FOR ANXIETY   amLODipine 5 MG tablet Commonly known as: NORVASC Take 1 tablet (5 mg total) by mouth daily. Started by: Kathlene November, MD   levothyroxine 112 MCG tablet Commonly known as: SYNTHROID TAKE 1 TABLET(112 MCG) BY MOUTH DAILY          Objective:   Physical Exam BP (!) 160/69 (BP Location: Right Arm, Patient Position: Sitting, Cuff Size: Large)   Pulse 62   Temp 98.1 F (36.7 C) (Oral)   Ht 5\' 3"  (1.6 m)   Wt 129 lb 9.6 oz (58.8 kg)   SpO2 100%   BMI 22.96 kg/m  General:   Well developed, NAD, BMI noted.  HEENT:  Normocephalic . Face symmetric, atraumatic Lungs:  CTA B Normal respiratory effort, no intercostal retractions, no accessory muscle use. Heart: RRR,  no murmur.  Abdomen:  Not distended, soft, non-tender. No rebound or rigidity.   Skin: Not pale. Not jaundice MSK: Hands and wrists with no  synovitis, some bony changes consistent with DJD Lower extremities: no pretibial edema bilaterally  Neurologic:  alert & oriented X3.  Speech normal, gait appropriate for age and unassisted Psych--  Cognition and judgment appear intact.  Cooperative with normal attention span and concentration.  Behavior appropriate. No anxious or depressed appearing.     Assessment      Assessment (new patient 01-2020, transfer) HTN DX 11/2020. Hypothyroidism Osteoporosis:  07-2019 T score -2.6. s/p fosamax x ~ 18 months, self d/c d/t aches  Insomnia   PLAN HTN: This summer went to donate blood, systolic BP was in the 353G.  At home is consistently in the 140s 150s. Based on this information dx is HTN, d/w pt  low-salt diet, remains active.   EKG today nsr Check BMP CBC Start amlodipine 5 mg, watch for swelling-constipation.  Monitor BPs at home, see AVS. Hypothyroidism: Due for TSH DJD: Occasional aches and pains, no fever, weight loss or synovitis on exam.  Likely DJD.  Symptoms are mild for now. Osteoporosis: Concerned about timing on the next DEXA, planned for 07-2021.  She remains active and takes vitamin D. Preventive care reviewed Insomnia: Controlled with Xanax RTC 6 months  This visit occurred during the SARS-CoV-2 public health emergency.  Safety protocols were in place, including screening questions prior to the visit, additional usage of staff PPE, and extensive cleaning of exam room while observing appropriate contact time as indicated for disinfecting solutions.

## 2020-11-11 NOTE — Patient Instructions (Addendum)
For high blood pressure: Start amlodipine 5 mg every night. Watch the salt intake, stay active.  Check the  blood pressure 2 or 3 times a month   BP GOAL is between 110/65 and  135/85. If it is consistently higher or lower, let me know    GO TO THE LAB : Get the blood work     Gay, Linda back for a checkup in 6 months

## 2020-11-12 DIAGNOSIS — M1990 Unspecified osteoarthritis, unspecified site: Secondary | ICD-10-CM | POA: Insufficient documentation

## 2020-11-12 NOTE — Assessment & Plan Note (Signed)
Preventive care: Had COVID vaccine including a booster. PNM 23: today Had a flu shot Last Pap 2019, plans to get an appointment w/ gyn Mammogram 05/2019, has one schedule Colonoscopy 07/2019, next per GI

## 2020-11-12 NOTE — Assessment & Plan Note (Signed)
HTN: This summer went to donate blood, systolic BP was in the 270B.  At home is consistently in the 140s 150s. Based on this information dx is HTN, d/w pt  low-salt diet, remains active.   EKG today nsr Check BMP CBC Start amlodipine 5 mg, watch for swelling-constipation.  Monitor BPs at home, see AVS. Hypothyroidism: Due for TSH DJD: Occasional aches and pains, no fever, weight loss or synovitis on exam.  Likely DJD.  Symptoms are mild for now. Osteoporosis: Concerned about timing on the next DEXA, planned for 07-2021.  She remains active and takes vitamin D. Preventive care reviewed Insomnia: Controlled with Xanax RTC 6 months

## 2020-12-14 ENCOUNTER — Encounter: Payer: Self-pay | Admitting: Internal Medicine

## 2020-12-14 ENCOUNTER — Telehealth (INDEPENDENT_AMBULATORY_CARE_PROVIDER_SITE_OTHER): Payer: Medicare Other | Admitting: Internal Medicine

## 2020-12-14 ENCOUNTER — Other Ambulatory Visit: Payer: Self-pay

## 2020-12-14 VITALS — BP 142/70 | Ht 63.0 in

## 2020-12-14 DIAGNOSIS — J069 Acute upper respiratory infection, unspecified: Secondary | ICD-10-CM | POA: Diagnosis not present

## 2020-12-14 MED ORDER — AMOXICILLIN 875 MG PO TABS: 875.0000 mg | ORAL_TABLET | Freq: Two times a day (BID) | ORAL | 0 refills | Status: DC

## 2020-12-14 MED ORDER — BENZONATATE 200 MG PO CAPS: 200.0000 mg | ORAL_CAPSULE | Freq: Three times a day (TID) | ORAL | 0 refills | Status: DC | PRN

## 2020-12-14 NOTE — Progress Notes (Signed)
   Subjective:    Patient ID: Linda Gay, female    DOB: 1956-01-11, 65 y.o.   MRN: [id number]  DOS:  12/14/2020 Type of visit - description: Virtual Visit via Video Note  I connected with the above patient  by a video enabled telemedicine application and verified that I am speaking with the correct person using two identifiers.   THIS ENCOUNTER IS A VIRTUAL VISIT DUE TO COVID-19 - PATIENT WAS NOT SEEN IN THE OFFICE. PATIENT HAS CONSENTED TO VIRTUAL VISIT / TELEMEDICINE VISIT   Location of patient: home  Location of provider: office  Persons participating in the virtual visit: patient, provider   I discussed the limitations of evaluation and management by telemedicine and the availability of in person appointments. The patient expressed understanding and agreed to proceed.  Acute Symptoms started a week ago: Cough, worse in the afternoon, head congestion and some postnasal dripping. Her mother had similar symptoms, went to the ER, diagnosed with walking pneumonia, on antibiotics.  Tested negative for COVID.  The patient herself had a home test for COVID yesterday: Negative.  She denies any fever, chills. No myalgias.  No nausea or vomiting. No rash Cough is not affecting her sleep.   Review of Systems See above   Past Medical History:  Diagnosis Date  . Osteoporosis   . Thyroid disease     Past Surgical History:  Procedure Laterality Date  . BREAST BIOPSY Left 01/30/2016  . COLONOSCOPY     12 yrs ago in [facility]  . HEMORRHOID SURGERY    . TONSILLECTOMY      Allergies as of 12/14/2020   No Known Allergies     Medication List       Accurate as of December 14, 2020 10:59 AM. If you have any questions, ask your nurse or doctor.        ALPRAZolam 0.5 MG tablet Commonly known as: XANAX TAKE 1 TABLET(0.5 MG) BY MOUTH AT BEDTIME AS NEEDED FOR ANXIETY   amLODipine 5 MG tablet Commonly known as: NORVASC Take 1 tablet (5 mg total) by mouth daily.   levothyroxine  112 MCG tablet Commonly known as: SYNTHROID TAKE 1 TABLET(112 MCG) BY MOUTH DAILY          Objective:   Physical Exam Ht 5\' 3"  (1.6 m)   BMI 22.96 kg/m  This is a virtual video visit, she is alert oriented x3, in no distress, no cough noted.    Assessment      Assessment (new patient 01-2020, transfer) HTN DX 11/2020. Hypothyroidism Osteoporosis:  07-2019 T score -2.6. s/p fosamax x ~ 18 months, self d/c d/t aches  Insomnia   PLAN URI: Symptoms going on for 1 week, predominantly sinus congestion and a cough. Tested negative for COVID yesterday.  Most likely has a URI. Plan: Mucinex or Mucinex DM, Tessalon Perles, Flonase.  Rest of fluids.  If not better in 3 to 4 days to start amoxicillin.  Detailed message with instructions sent.    I discussed the assessment and treatment plan with the patient. The patient was provided an opportunity to ask questions and all were answered. The patient agreed with the plan and demonstrated an understanding of the instructions.   The patient was advised to call back or seek an in-person evaluation if the symptoms worsen or if the condition fails to improve as anticipated.

## 2020-12-14 NOTE — Progress Notes (Signed)
Pre visit review using our clinic review tool, if applicable. No additional management support is needed unless otherwise documented below in the visit note. 

## 2020-12-15 NOTE — Assessment & Plan Note (Signed)
URI: Symptoms going on for 1 week, predominantly sinus congestion and a cough. Tested negative for COVID yesterday.  Most likely has a URI. Plan: Mucinex or Mucinex DM, Tessalon Perles, Flonase.  Rest of fluids.  If not better in 3 to 4 days to start amoxicillin.  Detailed message with instructions sent.

## 2020-12-16 ENCOUNTER — Ambulatory Visit: Payer: Medicare Other

## 2020-12-19 ENCOUNTER — Telehealth: Payer: Self-pay | Admitting: Internal Medicine

## 2020-12-20 NOTE — Telephone Encounter (Signed)
Requesting: alprazolam 0.5mg  Contract: 01/06/2020 UDS: 01/27/2020 Last Visit: 12/14/2020 Next Visit: 05/12/2021 Last Refill: 06/17/2020 #30 and 3RF  Please Advise

## 2020-12-20 NOTE — Telephone Encounter (Signed)
PDMP okay, prescription sent 

## 2020-12-29 ENCOUNTER — Telehealth: Payer: Self-pay | Admitting: Internal Medicine

## 2020-12-29 NOTE — Telephone Encounter (Signed)
See message below from Pt.     Hi Dr. Larose Kells My cough has not improved.  I've taken the OTC items you suggested and the amoxicillin, but the only thing that helps is to be still.  For instance, I don't cough once I go to bed or if I'm just sitting watching TV.  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  I don't have chest pain, but I can sometimes feel a "rattle" in my upper chest area.  Can you help? thank you, Linda Gay

## 2020-12-29 NOTE — Telephone Encounter (Signed)
Pt scheduled 12/30/20 w/ Dr. Nani Ravens.

## 2020-12-29 NOTE — Telephone Encounter (Signed)
MyChart message sent to Pt- informed her to call office to schedule visit with another provider in person- Dr. Larose Kells is unavailable until next week.

## 2020-12-29 NOTE — Telephone Encounter (Signed)
See patient's message, recommend in person office visit.

## 2020-12-30 ENCOUNTER — Encounter: Payer: Self-pay | Admitting: Family Medicine

## 2020-12-30 ENCOUNTER — Other Ambulatory Visit: Payer: Self-pay

## 2020-12-30 ENCOUNTER — Ambulatory Visit (INDEPENDENT_AMBULATORY_CARE_PROVIDER_SITE_OTHER): Payer: Medicare Other | Admitting: Family Medicine

## 2020-12-30 VITALS — BP 124/60 | HR 66 | Ht 63.0 in | Wt 133.0 lb

## 2020-12-30 DIAGNOSIS — R059 Cough, unspecified: Secondary | ICD-10-CM | POA: Diagnosis not present

## 2020-12-30 MED ORDER — FLUTICASONE PROPIONATE HFA 110 MCG/ACT IN AERO: 2.0000 | INHALATION_SPRAY | Freq: Two times a day (BID) | RESPIRATORY_TRACT | 12 refills | Status: DC

## 2020-12-30 MED ORDER — AZITHROMYCIN 250 MG PO TABS: ORAL_TABLET | ORAL | 0 refills | Status: DC

## 2020-12-30 NOTE — Patient Instructions (Signed)
Continue to push fluids, practice good hand hygiene, and cover your mouth if you cough.  If you start having fevers, shaking or shortness of breath, seek immediate care.  Rinse mouth out after using inhaler.  Let me know in 1 week if no better.  Let us know if you need anything.

## 2020-12-30 NOTE — Progress Notes (Signed)
Chief Complaint  Patient presents with  . Cough    Cough since 12/07/2019-paz gave amoxicillin, tessalon no improvement    Linda Gay is 65 y.o. and is here for a cough.  Duration: 3 weeks  Productive? No  Associated symptoms: worse when she exerts herself Denies: fever, night sweats, nasal congestion, rhinorrhea, sore throat, facial pain, chest pain, hemoptysis, dyspnea, wheezing Hx of GERD? No ACEi? No  Tried: Amoxicillin, Mucinex D, Tessalon Perles, INCS  Past Medical History:  Diagnosis Date  . Osteoporosis   . Thyroid disease    Family History  Problem Relation Age of Onset  . Hypertension Mother   . Cancer Father        prostate  . Prostate cancer Father   . Hypertension Brother   . Stroke Maternal Grandmother   . Heart disease Maternal Grandmother   . Cancer Maternal Grandfather        lung  . Diabetes Paternal Grandmother   . Cancer Paternal Grandfather        prostate  . Prostate cancer Paternal Grandfather   . Colon cancer Paternal Aunt   . Colon polyps Neg Hx   . Esophageal cancer Neg Hx   . Rectal cancer Neg Hx   . Stomach cancer Neg Hx    Allergies as of 12/30/2020   No Known Allergies     Medication List       Accurate as of December 30, 2020  1:36 PM. If you have any questions, ask your nurse or doctor.        STOP taking these medications   amoxicillin 875 MG tablet Commonly known as: AMOXIL Stopped by: Shelda Pal, DO   benzonatate 200 MG capsule Commonly known as: TESSALON Stopped by: Shelda Pal, DO     TAKE these medications   ALPRAZolam 0.5 MG tablet Commonly known as: XANAX TAKE 1 TABLET(0.5 MG) BY MOUTH AT BEDTIME AS NEEDED FOR ANXIETY   amLODipine 5 MG tablet Commonly known as: NORVASC Take 1 tablet (5 mg total) by mouth daily.   azithromycin 250 MG tablet Commonly known as: ZITHROMAX Take 2 tabs the first day and then 1 tab daily until you run out. Start taking on: January 01, 2021 Started by:  Shelda Pal, DO   fluticasone 110 MCG/ACT inhaler Commonly known as: FLOVENT HFA Inhale 2 puffs into the lungs in the morning and at bedtime. Rinse mouth out after use. Started by: Shelda Pal, DO   levothyroxine 112 MCG tablet Commonly known as: SYNTHROID TAKE 1 TABLET(112 MCG) BY MOUTH DAILY       BP 124/60 (BP Location: Left Arm, Patient Position: Sitting, Cuff Size: Normal)   Pulse 66   Ht 5\' 3"  (1.6 m)   Wt 133 lb (60.3 kg)   SpO2 97%   BMI 23.56 kg/m  Gen: Awake, alert, appears stated age HEENT: Ears neg, nares patent without D/C, turbinates unremarkable, Pharynx pink without exudate Neck: Supple, no masses or asymmetry, no tenderness Heart: RRR, no LE edema Lungs: CTAB, normal effort, no accessory muscle use Psych: Age appropriate judgement and insight, normal mood and affect  Cough - Plan: fluticasone (FLOVENT HFA) 110 MCG/ACT inhaler, azithromycin (ZITHROMAX) 250 MG tablet  INCS to decrease possible lung inflammation. Zpak for possible bacterial bronchitis coverage in 2 d if no better. CXR and f/u if no improvement.  F/u prn otherwise.  The patient voiced understanding and agreement to the plan.  [place], DO 12/30/20 1:36  PM

## 2021-01-02 MED ORDER — BECLOMETHASONE DIPROP HFA 40 MCG/ACT IN AERB: 2.0000 | INHALATION_SPRAY | Freq: Two times a day (BID) | RESPIRATORY_TRACT | 0 refills | Status: DC

## 2021-01-03 ENCOUNTER — Telehealth: Payer: Self-pay

## 2021-01-03 MED ORDER — BUDESONIDE 180 MCG/ACT IN AEPB: 1.0000 | INHALATION_SPRAY | Freq: Two times a day (BID) | RESPIRATORY_TRACT | 0 refills | Status: DC

## 2021-01-03 NOTE — Telephone Encounter (Signed)
Qvar Redihaler not covered. Preferred alternative: Flovent HFA 110mcg/act (she couldn't afford), Arnuity Ellipta 157mcg/act, Symbicort 160-4.46mcg/act, Breo Ellipta 100-45mcg/act, and Pulmicort Flexhaler 180mg /act

## 2021-01-03 NOTE — Telephone Encounter (Signed)
Sent a preferred alternative.

## 2021-01-20 ENCOUNTER — Other Ambulatory Visit: Payer: Self-pay | Admitting: Internal Medicine

## 2021-01-30 ENCOUNTER — Other Ambulatory Visit: Payer: Self-pay

## 2021-01-30 ENCOUNTER — Ambulatory Visit
Admission: RE | Admit: 2021-01-30 | Discharge: 2021-01-30 | Disposition: A | Discharge status: Home / Self Care | Payer: Medicare Other | Source: Ambulatory Visit | Attending: Internal Medicine | Admitting: Internal Medicine

## 2021-01-30 DIAGNOSIS — Z1231 Encounter for screening mammogram for malignant neoplasm of breast: Secondary | ICD-10-CM

## 2021-03-17 ENCOUNTER — Telehealth: Payer: Self-pay | Admitting: Internal Medicine

## 2021-03-20 NOTE — Telephone Encounter (Signed)
Requesting: alprazolam 0.5mg  Contract: 01/06/20 UDS: 01/27/20 Last Visit: 12/14/20 Next Visit: 05/12/21 Last Refill: 12/20/20 #30 and 1rf  Please Advise

## 2021-03-20 NOTE — Telephone Encounter (Signed)
pdmp ok, rx sent

## 2021-05-08 ENCOUNTER — Other Ambulatory Visit: Payer: Self-pay | Admitting: Internal Medicine

## 2021-05-08 DIAGNOSIS — E2839 Other primary ovarian failure: Secondary | ICD-10-CM

## 2021-05-12 ENCOUNTER — Other Ambulatory Visit: Payer: Self-pay

## 2021-05-12 ENCOUNTER — Encounter: Payer: Self-pay | Admitting: Internal Medicine

## 2021-05-12 ENCOUNTER — Ambulatory Visit (INDEPENDENT_AMBULATORY_CARE_PROVIDER_SITE_OTHER): Payer: Medicare Other | Admitting: Internal Medicine

## 2021-05-12 VITALS — BP 126/70 | HR 59 | Temp 98.2°F | Resp 16 | Ht 63.0 in | Wt 130.0 lb

## 2021-05-12 DIAGNOSIS — E039 Hypothyroidism, unspecified: Secondary | ICD-10-CM | POA: Diagnosis not present

## 2021-05-12 DIAGNOSIS — G4700 Insomnia, unspecified: Secondary | ICD-10-CM

## 2021-05-12 DIAGNOSIS — R739 Hyperglycemia, unspecified: Secondary | ICD-10-CM

## 2021-05-12 DIAGNOSIS — R011 Cardiac murmur, unspecified: Secondary | ICD-10-CM

## 2021-05-12 DIAGNOSIS — Z79899 Other long term (current) drug therapy: Secondary | ICD-10-CM

## 2021-05-12 DIAGNOSIS — I10 Essential (primary) hypertension: Secondary | ICD-10-CM | POA: Diagnosis not present

## 2021-05-12 LAB — LIPID PANEL
Cholesterol: 214 mg/dL — ABNORMAL HIGH (ref 0–200)
HDL: 95.9 mg/dL (ref >39.00)
LDL Cholesterol: 107 mg/dL — ABNORMAL HIGH (ref 0–99)
NonHDL: 118.23
Total CHOL/HDL Ratio: 2
Triglycerides: 56 mg/dL (ref 0.0–149.0)
VLDL: 11.2 mg/dL (ref 0.0–40.0)

## 2021-05-12 LAB — HEMOGLOBIN A1C: Hgb A1c MFr Bld: 5.1 % (ref 4.6–6.5)

## 2021-05-12 LAB — TSH: TSH: 1.11 u[IU]/mL (ref 0.35–4.50)

## 2021-05-12 NOTE — Patient Instructions (Signed)
Check the  blood pressure regularly BP GOAL is between 110/65 and  135/85. If it is consistently higher or lower, let me know   HOW TO TAKE YOUR BLOOD PRESSURE:   Rest 5 minutes before taking your blood pressure.   Don't smoke or drink caffeinated beverages for at least 30 minutes before.   Take your blood pressure before (not after) you eat.   Sit comfortably with your back supported and both feet on the floor (don't cross your legs).   Elevate your arm to heart level on a table or a desk.   Use the proper sized cuff. It should fit smoothly and snugly around your bare upper arm. There should be enough room to slip a fingertip under the cuff. The bottom edge of the cuff should be 1 inch above the crease of the elbow.   Ideally, take 3 measurements at one sitting and record the average.    We will schedule an echocardiogram of your heart in [place], expect a phone call   Consider getting a shingles shot vaccination at your local pharmacy   [facility] : Get the blood work     [place], [place] back for a checkup in 4 months

## 2021-05-12 NOTE — Progress Notes (Signed)
Subjective:    Patient ID: Linda Gay, female    DOB: 08/26/1956, 65 y.o.   MRN: [id number]  DOS:  05/12/2021 Type of visit - description: Routine visit  Today with talk about several issues: She is concerned about her family history of dementia, but has not noticed any issues. Concerned about her family history of late onset diabetes Has long-term tinnitus and question of HOH. Today I found R heart murmur on exam, she denies chest pain, difficulty breathing, edema.   BP Readings from Last 3 Encounters:  05/12/21 126/70  12/30/20 124/60  12/14/20 (!) 142/70     Review of Systems See above   Past Medical History:  Diagnosis Date   Osteoporosis    Thyroid disease     Past Surgical History:  Procedure Laterality Date   BREAST BIOPSY Left 01/30/2016   COLONOSCOPY     12 yrs ago in [facility]   HEMORRHOID SURGERY     TONSILLECTOMY      Allergies as of 05/12/2021   No Known Allergies      Medication List        Accurate as of May 12, 2021 11:59 PM. If you have any questions, ask your nurse or doctor.          STOP taking these medications    azithromycin 250 MG tablet Commonly known as: ZITHROMAX Stopped by: Kathlene November, MD   budesonide 180 MCG/ACT inhaler Commonly known as: PULMICORT Stopped by: Kathlene November, MD       TAKE these medications    ALPRAZolam 0.5 MG tablet Commonly known as: XANAX TAKE 1 TABLET(0.5 MG) BY MOUTH AT BEDTIME AS NEEDED FOR ANXIETY   amLODipine 5 MG tablet Commonly known as: NORVASC Take 1 tablet (5 mg total) by mouth daily.   levothyroxine 112 MCG tablet Commonly known as: SYNTHROID Take 1 tablet (112 mcg total) by mouth daily before breakfast.           Objective:   Physical Exam BP 126/70 (BP Location: Left Arm, Patient Position: Sitting, Cuff Size: Small)   Pulse (!) 59   Temp 98.2 F (36.8 C) (Oral)   Resp 16   Ht 5\' 3"  (1.6 m)   Wt 130 lb (59 kg)   SpO2 96%   BMI 23.03 kg/m  General:   Well developed,  NAD, BMI noted. HEENT:  Normocephalic . Face symmetric, atraumatic Lungs:  CTA B Normal respiratory effort, no intercostal retractions, no accessory muscle use. Heart: RRR, soft systolic murmur, L side of the sternum.  Lower extremities: no pretibial edema bilaterally  Skin: Not pale. Not jaundice Neurologic:  alert & oriented X3.  Speech normal, gait appropriate for age and unassisted Psych--  Cognition and judgment appear intact.  Cooperative with normal attention span and concentration.  Behavior appropriate. No anxious or depressed appearing.      Assessment    ASSESSMENT(new patient 01-2020, transfer) HTN DX 11/2020. Hypothyroidism Osteoporosis:  07-2019 T score -2.6. s/p fosamax x ~ 18 months, self d/c d/t aches  Insomnia   PLAN HTN: Ambulatory BPs consistently 135/66, at this office the last 2 times it has been in the 120s.  We agreed to continue amlodipine 5 mg, low-salt diet, exercise, monitor BPs, call to adjust medication. Hypothyroidism: Check TSH Insomnia: On Xanax as needed, check UDS FH h/o late onset diabetes and Alzheimer: Check A1c, advised patient that a healthy lifestyle, healthy diet, socialize, stay active mentally all will help prevent those conditions. Heart  murmur: New, no sxs last EKG NSR.  Check echo Preventive care: Discussed Shingrix Had COVID vaccines x3, consider a fourth. [place] . RTC 4 [place]  This visit occurred during the SARS-CoV-2 public health emergency.  Safety protocols were in place, including screening questions prior to the visit, additional usage of staff PPE, and extensive cleaning of exam room while observing appropriate contact time as indicated for disinfecting solutions.

## 2021-05-13 NOTE — Assessment & Plan Note (Signed)
HTN: Ambulatory BPs consistently 135/66, at this office the last 2 times it has been in the 120s.  We agreed to continue amlodipine 5 mg, low-salt diet, exercise, monitor BPs, call to adjust medication. Hypothyroidism: Check TSH Insomnia: On Xanax as needed, check UDS FH h/o late onset diabetes and Alzheimer: Check A1c, advised patient that a healthy lifestyle, healthy diet, socialize, stay active mentally all will help prevent those conditions. Heart murmur: New, no sxs last EKG NSR.  Check echo Preventive care: Discussed Shingrix Had COVID vaccines x3, consider a fourth. [place] RTC 4 months

## 2021-05-15 LAB — DRUG MONITORING, PANEL 8 WITH CONFIRMATION, URINE
6 Acetylmorphine: NEGATIVE ng/mL (ref <10)
Alcohol Metabolites: POSITIVE ng/mL — AB
Alphahydroxyalprazolam: 58 ng/mL — ABNORMAL HIGH (ref <25)
Alphahydroxymidazolam: NEGATIVE ng/mL (ref <50)
Alphahydroxytriazolam: NEGATIVE ng/mL (ref <50)
Aminoclonazepam: NEGATIVE ng/mL (ref <25)
Amphetamines: NEGATIVE ng/mL (ref <500)
Benzodiazepines: POSITIVE ng/mL — AB (ref <100)
Buprenorphine, Urine: NEGATIVE ng/mL (ref <5)
Cocaine Metabolite: NEGATIVE ng/mL (ref <150)
Creatinine: 91.6 mg/dL
Ethyl Glucuronide (ETG): 2852 ng/mL — ABNORMAL HIGH (ref <500)
Ethyl Sulfate (ETS): 777 ng/mL — ABNORMAL HIGH (ref <100)
Hydroxyethylflurazepam: NEGATIVE ng/mL (ref <50)
Lorazepam: NEGATIVE ng/mL (ref <50)
MDMA: NEGATIVE ng/mL (ref <500)
Marijuana Metabolite: NEGATIVE ng/mL (ref <20)
Nordiazepam: NEGATIVE ng/mL (ref <50)
Opiates: NEGATIVE ng/mL (ref <100)
Oxazepam: NEGATIVE ng/mL (ref <50)
Oxidant: NEGATIVE ug/mL
Oxycodone: NEGATIVE ng/mL (ref <100)
Temazepam: NEGATIVE ng/mL (ref <50)
pH: 7.5 (ref 4.5–9.0)

## 2021-05-15 LAB — DM TEMPLATE

## 2021-05-25 ENCOUNTER — Other Ambulatory Visit: Payer: Self-pay

## 2021-05-25 ENCOUNTER — Encounter: Payer: Self-pay | Admitting: Internal Medicine

## 2021-05-25 ENCOUNTER — Ambulatory Visit (HOSPITAL_COMMUNITY)
Admission: RE | Admit: 2021-05-25 | Discharge: 2021-05-25 | Disposition: A | Discharge status: Home / Self Care | Payer: Medicare Other | Source: Ambulatory Visit | Attending: Internal Medicine | Admitting: Internal Medicine

## 2021-05-25 DIAGNOSIS — I351 Nonrheumatic aortic (valve) insufficiency: Secondary | ICD-10-CM | POA: Diagnosis not present

## 2021-05-25 DIAGNOSIS — R600 Localized edema: Secondary | ICD-10-CM

## 2021-05-25 DIAGNOSIS — I700 Atherosclerosis of aorta: Secondary | ICD-10-CM | POA: Diagnosis not present

## 2021-05-25 DIAGNOSIS — R011 Cardiac murmur, unspecified: Secondary | ICD-10-CM

## 2021-05-25 LAB — ECHOCARDIOGRAM COMPLETE
AV Vena cont: 0.3 cm
Area-P 1/2: 3.65 cm2
P 1/2 time: 637 msec
S' Lateral: 2.8 cm

## 2021-06-12 ENCOUNTER — Telehealth: Payer: Self-pay | Admitting: Internal Medicine

## 2021-06-13 NOTE — Telephone Encounter (Signed)
PDMP okay, Rx sent 

## 2021-06-13 NOTE — Telephone Encounter (Signed)
Requesting: alprazolam 0.5mg  Contract: 05/12/2021 UDS: 05/12/2021 Last Visit: 05/12/2021 Next Visit: 09/11/2021 Last Refill: 03/20/2021 #30 and 1RF  Please Advise

## 2021-07-21 ENCOUNTER — Telehealth: Payer: Self-pay | Admitting: Internal Medicine

## 2021-07-21 NOTE — Telephone Encounter (Signed)
Left message for patient to call back and schedule Medicare Annual Wellness Visit (AWV) in office.   If not able to come in office, please offer to do virtually or by telephone.  Left office number and my jabber #[phone].  Due for AWVI  Please schedule at anytime with Nurse Health Advisor.   

## 2021-07-26 ENCOUNTER — Other Ambulatory Visit: Payer: Self-pay | Admitting: Internal Medicine

## 2021-07-31 NOTE — Progress Notes (Signed)
Cardiology Office Note:    Date:  08/01/2021   ID:  Linda, Gay [birth year], MRN [id number]  PCP:  Colon Branch, MD  Cardiologist:  Shirlee More, MD   Referring MD: Colon Branch, MD  ASSESSMENT:    1. Nonrheumatic aortic valve insufficiency   2. Primary hypertension    PLAN:    In order of problems listed above:  I independently reviewed her echocardiogram she has mild aortic regurgitation the valve is trileaflet and she has mild degenerative senile aging aortic valve sclerosis.  I reviewed the nature I do not think she is at risk of progression of the foreseeable future and does not require endocarditis prophylaxis.  She is reassured she is to have a follow-up echocardiogram in about 2 years and tells me she anticipates relocating to the [place] of [place]. BP not at target we will add a low-dose of an MRA with follow-up renal function in 2 weeks  Next appointment as needed as she is relocating to the [place]   Medication Adjustments/Labs and Tests Ordered: Current medicines are reviewed at length with the patient today.  Concerns regarding medicines are outlined above.  Orders Placed This Encounter  Procedures   EKG 12-Lead   Meds ordered this encounter  Medications   eplerenone (INSPRA) 25 MG tablet    Sig: Take 1 tablet (25 mg total) by mouth daily.    Dispense:  90 tablet    Refill:  3  Chief complaint, recent heart murmur and echocardiogram shows aortic regurgitation  History of Present Illness:    Linda Gay is a 65 y.o. female who is being seen today for the evaluation of valvular heart disease with aortic regurgitation on recent echocardiogram at the request of Colon Branch, MD.  Echocardiogram performed 05/25/2021 showed a degenerative valve with aortic valve sclerosis no stenosis and mild to moderate regurgitation.  Left ventricle is normal in size wall thickness systolic function and diastolic function.  There is no other valvular  abnormality.  AR regurgitant pressure half-time 637 ms.  Contracted 0.3 cm.  Component Ref Range & Units 2 mo ago 1 yr ago 3 yr ago 4 yr ago 5 yr ago 6 yr ago 7 yr ago  Cholesterol 0 - 200 mg/dL 214 High   231 High  CM  207 High  CM  214 High  CM  203 High  CM  205 High  CM  208 High  CM   Comment: ATP III Classification       Desirable:  < 200 mg/dL               Borderline High:  200 - 239 mg/dL          High:  > = 240 mg/dL  Triglycerides 0.0 - 149.0 mg/dL 56.0  60.0 CM  53.0 CM  57.0 CM  58.0 CM  70.0 CM  58.0 CM   Comment: Normal:  <150 mg/dLBorderline High:  150 - 199 mg/dL  HDL >39.00 mg/dL 95.90  83.40  84.80  82.90  76.10  86.20  88.30   VLDL 0.0 - 40.0 mg/dL 11.2  12.0  10.6  11.4  11.6  14.0  11.6   LDL Cholesterol 0 - 99 mg/dL 107 High   136 High   111 High   120 High   115 High   105 High     Total CHOL/HDL Ratio  2  3 CM  2 CM  3 CM  3 CM  2 CM  2 CM     She has no known history of heart disease congenital rheumatic and never had a heart murmur auscultated in the past She has been on antihypertensive therapy for several months and her systolic blood pressure generally runs greater than 140/70-80. She has had no fevers or chills. No chest pain edema shortness of breath palpitation or syncope. Past Medical History:  Diagnosis Date   Osteoporosis    Thyroid disease     Past Surgical History:  Procedure Laterality Date   BREAST BIOPSY Left 01/30/2016   COLONOSCOPY     12 [place]rs ago [place] [facility]   HEMORRHOID SURGERY     TONSILLECTOMY      Current Medications: Current Meds  Medication Sig   ALPRAZolam (XANAX) 0.5 MG tablet TAKE 1 TABLET(0.5 MG) BY MOUTH AT BEDTIME AS NEEDED FOR ANXIETY   amLODipine (NORVASC) 5 MG tablet Take 1 tablet (5 mg total) by mouth daily.   eplerenone (INSPRA) 25 MG tablet Take 1 tablet (25 mg total) by mouth daily.   levothyroxine (SYNTHROID) 112 MCG tablet Take 1 tablet (112 mcg total) by mouth daily before breakfast.     Allergies:   Patient has no  known allergies.   Social History   Socioeconomic History   Marital status: Widowed    Spouse name: Not on file   Number of children: Not on file   Years of education: Not on file   Highest education level: Not on file  Occupational History   Occupation: part time [employer]  Tobacco Use   Smoking status: Never   Smokeless tobacco: Never  Vaping Use   Vaping Use: Never used  Substance and Sexual Activity   Alcohol use: Yes    Comment: wine daily 1-2 glasses a dat    Drug use: No   Sexual activity: Yes    Partners: Male  Other Topics Concern   Not on file  Social History Narrative   Widowed, lives by herself    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   Regular exercise: no   Caffeine use: daily      Has step children   Social Determinants of Radio broadcast assistant Strain: Not on file  Food Insecurity: Not on file  Transportation Needs: Not on file  Physical Activity: Not on file  Stress: Not on file  Social Connections: Not on file     Family History: The patient's family history includes Cancer in her father, maternal grandfather, and paternal grandfather; Colon cancer in her paternal aunt; Diabetes in her paternal grandmother; Heart disease in her maternal grandmother; Hypertension in her brother and mother; Prostate cancer in her father and paternal grandfather; Stroke in her maternal grandmother. There is no history of Colon polyps, Esophageal cancer, Rectal cancer, or Stomach cancer.  ROS:   ROS Please see the history of present illness.     All other systems reviewed and are negative.  EKGs/Labs/Other Studies Reviewed:    The following studies were reviewed today: I personally reviewed the echocardiogram images performed in the [facility] office  EKG:  EKG is  ordered today.  The ekg ordered today is personally reviewed and demonstrates sinus rhythm 60 bpm normal EKG EKG 11/11/2020 sinus bradycardia 57 bpm otherwise normal Recent Labs: 11/11/2020: ALT 15;  BUN 13; Creatinine, Ser 0.71; Hemoglobin 13.5; Platelets 174.0; Potassium 4.1; Sodium 138 05/12/2021: TSH 1.11  Recent Lipid Panel    Component Value  Date/Time   CHOL 214 (H) 05/12/2021 0850   TRIG 56.0 05/12/2021 0850   HDL 95.90 05/12/2021 0850   CHOLHDL 2 05/12/2021 0850   VLDL 11.2 05/12/2021 0850   LDLCALC 107 (H) 05/12/2021 0850   LDLDIRECT 108.5 09/08/2013 0813    Physical Exam:    VS:  BP (!) 152/74 (BP Location: Right Arm, Patient Position: Sitting, Cuff Size: Normal)   Pulse 60   Ht '5\' 3"'$  (1.6 m)   Wt 129 lb 0.6 oz (58.5 kg)   SpO2 99%   BMI 22.86 kg/m     Wt Readings from Last 3 Encounters:  08/01/21 129 lb 0.6 oz (58.5 kg)  05/12/21 130 lb (59 kg)  12/30/20 133 lb (60.3 kg)     GEN:  Well nourished, well developed in no acute distress HEENT: Normal NECK: No JVD; No carotid bruits LYMPHATICS: No lymphadenopathy CARDIAC: She has a grade 1/6 murmur aortic regurgitation auscultated in the aortic area there is no click no gallop RRR, no rubs, gallops RESPIRATORY:  Clear to auscultation without rales, wheezing or rhonchi  ABDOMEN: Soft, non-tender, non-distended MUSCULOSKELETAL:  No edema; No deformity  SKIN: Warm and dry NEUROLOGIC:  Alert and oriented x 3 PSYCHIATRIC:  Normal affect     Signed, Shirlee More, MD  08/01/2021 12:06 PM    [facility]

## 2021-08-01 ENCOUNTER — Ambulatory Visit (INDEPENDENT_AMBULATORY_CARE_PROVIDER_SITE_OTHER): Payer: Medicare Other | Admitting: Cardiology

## 2021-08-01 ENCOUNTER — Encounter: Payer: Self-pay | Admitting: Cardiology

## 2021-08-01 ENCOUNTER — Other Ambulatory Visit: Payer: Self-pay

## 2021-08-01 VITALS — BP 152/74 | HR 60 | Ht 63.0 in | Wt 129.0 lb

## 2021-08-01 DIAGNOSIS — I10 Essential (primary) hypertension: Secondary | ICD-10-CM

## 2021-08-01 DIAGNOSIS — I351 Nonrheumatic aortic (valve) insufficiency: Secondary | ICD-10-CM

## 2021-08-01 MED ORDER — EPLERENONE 25 MG PO TABS: 25.0000 mg | ORAL_TABLET | Freq: Every day | ORAL | 3 refills | Status: DC

## 2021-08-01 NOTE — Patient Instructions (Signed)
Medication Instructions:  Your physician has recommended you make the following change in your medication:  START: Inspra 25 mg take one tablet by mouth daily.  *If you need a refill on your cardiac medications before your next appointment, please call your pharmacy*   Lab Work: Your physician recommends that you return for lab work in: 2 weeks BMP in your PCP office If you have labs (blood work) drawn today and your tests are completely normal, you will receive your results only by: [place] (if you have MyChart) OR A paper copy in the mail If you have any lab test that is abnormal or we need to change your treatment, we will call you to review the results.   Testing/Procedures: None   Follow-Up: At [facility], you and your health needs are our priority.  As part of our continuing mission to provide you with exceptional heart care, we have created designated Provider Care Teams.  These Care Teams include your primary Cardiologist (physician) and Advanced Practice Providers (APPs -  Physician Assistants and Nurse Practitioners) who all work together to provide you with the care you need, when you need it.  We recommend signing up for the patient portal called "MyChart".  Sign up information is provided on this After Visit Summary.  MyChart is used to connect with patients for Virtual Visits (Telemedicine).  Patients are able to view lab/test results, encounter notes, upcoming appointments, etc.  Non-urgent messages can be sent to your provider as well.   To learn more about what you can do with MyChart, go to [email or web address].    Your next appointment:   As needed  The format for your next appointment:   In Person  Provider:   Shirlee More, MD   Other Instructions

## 2021-08-03 MED ORDER — SPIRONOLACTONE 25 MG PO TABS: 25.0000 mg | ORAL_TABLET | Freq: Every day | ORAL | 3 refills | Status: DC

## 2021-08-15 ENCOUNTER — Telehealth: Payer: Self-pay | Admitting: Cardiology

## 2021-08-15 DIAGNOSIS — I10 Essential (primary) hypertension: Secondary | ICD-10-CM

## 2021-08-15 NOTE — Telephone Encounter (Signed)
Patient states during her last visit with Dr. Bettina Gavia she was advised to have lab work with Dr. Larose Kells' office. She states she contacted Dr. Larose Kells' office and they need orders. Please send orders and contact patient to confirm.

## 2021-08-15 NOTE — Telephone Encounter (Signed)
Left a message on the patients voicemail letting her know that this has been sent.

## 2021-08-16 ENCOUNTER — Telehealth: Payer: Self-pay

## 2021-08-16 NOTE — Telephone Encounter (Signed)
Patient called and stated that Dr. Joya Gaskins nurse informed her that she could have her labs done in our office, I informed the patient that we do not do cardiology labs but I would contact cardiology to get clarification. Spoke with Glenard Haring at cardiology and informed her that the patient needed clarification on where to have her labs done and she state that she would send a message to Dr. Joya Gaskins nurse to contact patient.

## 2021-08-16 NOTE — Telephone Encounter (Signed)
   [facility] called, she said they don't do cards labs in their office and pt needs to get it from a [facility] office. She wanted the nurse to call pt to explain and r/s it to a different labs. She said even if pt made an arrangement with Dr. Larose Kells, they cant still do cards labs there.

## 2021-08-16 NOTE — Telephone Encounter (Signed)
   Patient Name: Linda Gay  DOB: [birth year] MRN: [id number]  Primary Cardiologist: Shirlee More, MD  Chart reviewed as part of pre-operative protocol coverage. Cataract extractions are recognized in guidelines as low risk surgeries that do not typically require specific preoperative testing or holding of blood thinner therapy. Therefore, given past medical history and time since last visit, based on ACC/AHA guidelines, Linda Gay would be at acceptable risk for the planned procedure without further cardiovascular testing.   I will route this recommendation to the requesting party via Epic fax function and remove from pre-op pool.  Please call with questions.  [place], PA 08/16/2021, 2:14 PM

## 2021-08-16 NOTE — Telephone Encounter (Signed)
Spoke to the patient just now and let her know that her PCP was not willing to draw this lab for her. I am very surprised by this but the patient states that she will come to our office to have this drawn.    Encouraged patient to call back with any questions or concerns.

## 2021-08-16 NOTE — Addendum Note (Signed)
Addended by: Resa Miner I on: 08/16/2021 01:08 PM   Modules accepted: Orders

## 2021-08-16 NOTE — Telephone Encounter (Signed)
Called patient to follow up on recent call to verify clarification with patient. Linda Gay states she did receive a call from Dr. Joya Gaskins office and was informed that we would not do her labs. I explained to patient that's not the case we do not do cardiology labs for billing reasons and the nurse could have called our office to  have her PCP to order labs and she could have them done here. Patient states she will go to Dr. Joya Gaskins office to have labs done.

## 2021-08-16 NOTE — Telephone Encounter (Deleted)
Patient called and stated that Dr. Joya Gaskins nurse

## 2021-08-16 NOTE — Telephone Encounter (Signed)
   [facility] Pre-operative Risk Assessment    Request for surgical clearance:  What type of surgery is being performed? Cataract extraction w/ Intraocular Lens implantation   When is this surgery scheduled? Right eye 08/30/21 and Left eye 09/13/21   What type of clearance is required (medical clearance vs. Pharmacy clearance to hold med vs. Both)? Medical  Are there any medications that need to be held prior to surgery and how long?Not specified    Practice name and name of physician performing surgery? Dr Talbert Forest at [facility]    What is your office phone number: [phone]    8.   What is your office fax number: [phone]  8.   Anesthesia type (None, local, MAC, general) ? Not specified   Basil Dess Bennetta Rudden 08/16/2021, 10:45 AM  _________________________________________________________________   (provider comments below)

## 2021-08-18 LAB — BASIC METABOLIC PANEL
BUN/Creatinine Ratio: 20 (ref 12–28)
BUN: 15 mg/dL (ref 8–27)
CO2: 24 mmol/L (ref 20–29)
Calcium: 9.9 mg/dL (ref 8.7–10.3)
Chloride: 102 mmol/L (ref 96–106)
Creatinine, Ser: 0.75 mg/dL (ref 0.57–1.00)
Glucose: 139 mg/dL — ABNORMAL HIGH (ref 65–99)
Potassium: 4.2 mmol/L (ref 3.5–5.2)
Sodium: 140 mmol/L (ref 134–144)
eGFR: 87 mL/min/{1.73_m2} (ref >59)

## 2021-08-18 NOTE — Telephone Encounter (Signed)
Called patient to extend an apology about her lab draw and the confusion around it.

## 2021-08-21 ENCOUNTER — Telehealth: Payer: Self-pay

## 2021-08-21 NOTE — Telephone Encounter (Signed)
Pt is returning call.  

## 2021-08-21 NOTE — Telephone Encounter (Signed)
-----   Message from Berniece Salines, DO sent at 08/20/2021  7:45 PM EDT ----- This is Dr. Joya Gaskins patient, labs stable

## 2021-08-21 NOTE — Telephone Encounter (Signed)
Left message on patients voicemail to please return our call.   

## 2021-08-21 NOTE — Telephone Encounter (Signed)
Spoke with patient regarding results and recommendation.  Patient verbalizes understanding and is agreeable to plan of care. Advised patient to call back with any issues or concerns.  

## 2021-09-11 ENCOUNTER — Other Ambulatory Visit: Payer: Self-pay

## 2021-09-11 ENCOUNTER — Encounter: Payer: Self-pay | Admitting: Internal Medicine

## 2021-09-11 ENCOUNTER — Ambulatory Visit: Payer: Medicare Other | Attending: Internal Medicine

## 2021-09-11 ENCOUNTER — Ambulatory Visit (INDEPENDENT_AMBULATORY_CARE_PROVIDER_SITE_OTHER): Payer: Medicare Other | Admitting: Internal Medicine

## 2021-09-11 VITALS — BP 132/66 | HR 58 | Temp 98.2°F | Resp 16 | Ht 63.0 in | Wt 129.4 lb

## 2021-09-11 DIAGNOSIS — E559 Vitamin D deficiency, unspecified: Secondary | ICD-10-CM | POA: Diagnosis not present

## 2021-09-11 DIAGNOSIS — I10 Essential (primary) hypertension: Secondary | ICD-10-CM | POA: Diagnosis not present

## 2021-09-11 DIAGNOSIS — Z23 Encounter for immunization: Secondary | ICD-10-CM

## 2021-09-11 DIAGNOSIS — E039 Hypothyroidism, unspecified: Secondary | ICD-10-CM

## 2021-09-11 DIAGNOSIS — M810 Age-related osteoporosis without current pathological fracture: Secondary | ICD-10-CM

## 2021-09-11 DIAGNOSIS — I351 Nonrheumatic aortic (valve) insufficiency: Secondary | ICD-10-CM | POA: Insufficient documentation

## 2021-09-11 NOTE — Assessment & Plan Note (Signed)
HTN: When she saw cardiology, BP not at goal, Aldactone was added, subsequent BMP okay.  She also takes amlodipine.  BP today is okay, recommend to check at home. Hypothyroidism: Check TSH Insomnia: Well-controlled at present, hardly ever takes Xanax Aortic regurgitation: Since the last visit, echo show aortic regurgitation, saw cardiology 08/01/2021.  Next echo  2024. Osteoporosis: Currently on vitamin D.  Encourage to be more active.  Has a DEXA scheduled for November 2022. Vitamin D deficiency: On OTCs, dosage?  Check level. Preventive care: Flu shot today COVID-vaccine booster recommended. Shingrix recommended Social: Moved to his brother here in [place]. RTC 5 to 6 months

## 2021-09-11 NOTE — Patient Instructions (Addendum)
Recommend to proceed with the following vaccines at your pharmacy:  Covid #4 Shingrix (shingles)   GO TO THE LAB : Get the blood work     [place] back for  a check up in 5 to 6 months

## 2021-09-11 NOTE — Progress Notes (Signed)
Subjective:    Patient ID: Linda Gay, female    DOB: August 12, 1956, 65 y.o.   MRN: [id number]  DOS:  09/11/2021 Type of visit - description: rov Since the last office visit saw cardiology, chart reviewed. She is doing well.  Review of Systems See above   Past Medical History:  Diagnosis Date   Osteoporosis    Thyroid disease     Past Surgical History:  Procedure Laterality Date   BREAST BIOPSY Left 01/30/2016   CATARACT EXTRACTION Bilateral    COLONOSCOPY     12 yrs ago in [facility]   HEMORRHOID SURGERY     TONSILLECTOMY      Allergies as of 09/11/2021   No Known Allergies      Medication List        Accurate as of September 11, 2021  2:20 PM. If you have any questions, ask your nurse or doctor.          ALPRAZolam 0.5 MG tablet Commonly known as: XANAX TAKE 1 TABLET(0.5 MG) BY MOUTH AT BEDTIME AS NEEDED FOR ANXIETY   amLODipine 5 MG tablet Commonly known as: NORVASC Take 1 tablet (5 mg total) by mouth daily.   levothyroxine 112 MCG tablet Commonly known as: SYNTHROID Take 1 tablet (112 mcg total) by mouth daily before breakfast.   spironolactone 25 MG tablet Commonly known as: ALDACTONE Take 1 tablet (25 mg total) by mouth daily.           Objective:   Physical Exam BP 132/66 (BP Location: Left Arm, Patient Position: Sitting, Cuff Size: Small)   Pulse (!) 58   Temp 98.2 F (36.8 C) (Oral)   Resp 16   Ht 5\' 3"  (1.6 m)   Wt 129 lb 6 oz (58.7 kg)   SpO2 98%   BMI 22.92 kg/m  General:   Well developed, NAD, BMI noted. HEENT:  Normocephalic . Face symmetric, atraumatic Lungs:  CTA B Normal respiratory effort, no intercostal retractions, no accessory muscle use. Heart: Soft systolic murmur.  Regular rate and rhythm. Lower extremities: no pretibial edema bilaterally  Skin: Not pale. Not jaundice Neurologic:  alert & oriented X3.  Speech normal, gait appropriate for age and unassisted Psych--  Cognition and judgment appear intact.   Cooperative with normal attention span and concentration.  Behavior appropriate. No anxious or depressed appearing.      Assessment     ASSESSMENT(new patient 01-2020, transfer) HTN DX 11/2020. Hypothyroidism Osteoporosis:  07-2019 T score -2.6. s/p fosamax x ~ 18 months, self d/c d/t aches  Insomnia Aortic regurgitation  PLAN HTN: When she saw cardiology, BP not at goal, Aldactone was added, subsequent BMP okay.  She also takes amlodipine.  BP today is okay, recommend to check at home. Hypothyroidism: Check TSH Insomnia: Well-controlled at present, hardly ever takes Xanax Aortic regurgitation: Since the last visit, echo show aortic regurgitation, saw cardiology 08/01/2021.  Next echo  2024. Osteoporosis: Currently on vitamin D.  Encourage to be more active.  Has a DEXA scheduled for November 2022. Vitamin D deficiency: On OTCs, dosage?  Check level. Preventive care: Flu shot today COVID-vaccine booster recommended. Shingrix recommended Social: Moved to his brother here in [place]. RTC 5 to 6 months    This visit occurred during the SARS-CoV-2 public health emergency.  Safety protocols were in place, including screening questions prior to the visit, additional usage of staff PPE, and extensive cleaning of exam room while observing appropriate contact time as indicated for  disinfecting solutions.

## 2021-09-11 NOTE — Progress Notes (Signed)
   Covid-19 Vaccination Clinic  Name:  Linda Gay    MRN: [id number] DOB: 01/02/1956  09/11/2021  [name] was observed post Covid-19 immunization for 15 minutes without incident. She was provided with Vaccine Information Sheet and instruction to access the V-Safe system.   [name] was instructed to call 911 with any severe reactions post vaccine: Difficulty breathing  Swelling of face and throat  A fast heartbeat  A bad rash all over body  Dizziness and weakness

## 2021-09-12 LAB — VITAMIN D 25 HYDROXY (VIT D DEFICIENCY, FRACTURES): VITD: 30.25 ng/mL (ref 30.00–100.00)

## 2021-09-12 LAB — TSH: TSH: 0.48 u[IU]/mL (ref 0.35–5.50)

## 2021-09-20 ENCOUNTER — Other Ambulatory Visit (HOSPITAL_BASED_OUTPATIENT_CLINIC_OR_DEPARTMENT_OTHER): Payer: Self-pay

## 2021-09-20 MED ORDER — COVID-19MRNA BIVAL VACC PFIZER 30 MCG/0.3ML IM SUSP
INTRAMUSCULAR | 0 refills | Status: DC
  Filled 2021-09-20: qty 0.3, 1d supply, fill #0

## 2021-10-20 ENCOUNTER — Other Ambulatory Visit: Payer: Self-pay | Admitting: Internal Medicine

## 2021-10-23 ENCOUNTER — Ambulatory Visit
Admission: RE | Admit: 2021-10-23 | Discharge: 2021-10-23 | Disposition: A | Discharge status: Home / Self Care | Payer: Medicare Other | Source: Ambulatory Visit | Attending: Internal Medicine | Admitting: Internal Medicine

## 2021-10-23 DIAGNOSIS — E2839 Other primary ovarian failure: Secondary | ICD-10-CM

## 2021-10-30 ENCOUNTER — Ambulatory Visit: Payer: Medicare Other

## 2021-10-30 NOTE — Progress Notes (Deleted)
Subjective:   Linda Gay is a 65 y.o. female who presents for an Initial Medicare Annual Wellness Visit.  I connected with Kingsley today by telephone and verified that I am speaking with the correct person using two identifiers. Location patient: home Location provider: work Persons participating in the virtual visit: patient, Marine scientist.    I discussed the limitations, risks, security and privacy concerns of performing an evaluation and management service by telephone and the availability of in person appointments. I also discussed with the patient that there may be a patient responsible charge related to this service. The patient expressed understanding and verbally consented to this telephonic visit.    Interactive audio and video telecommunications were attempted between this provider and patient, however failed, due to patient having technical difficulties OR patient did not have access to video capability.  We continued and completed visit with audio only.  Some vital signs may be absent or patient reported.   Time Spent with patient on telephone encounter: *** minutes   Review of Systems    ***       Objective:    There were no vitals filed for this visit. There is no height or weight on file to calculate BMI.  No flowsheet data found.  Current Medications (verified) Outpatient Encounter Medications as of 10/30/2021  Medication Sig   ALPRAZolam (XANAX) 0.5 MG tablet TAKE 1 TABLET(0.5 MG) BY MOUTH AT BEDTIME AS NEEDED FOR ANXIETY   amLODipine (NORVASC) 5 MG tablet Take 1 tablet (5 mg total) by mouth daily.   COVID-19 mRNA bivalent vaccine, Pfizer, injection Inject into the muscle.   levothyroxine (SYNTHROID) 112 MCG tablet TAKE 1 TABLET(112 MCG) BY MOUTH DAILY BEFORE BREAKFAST   spironolactone (ALDACTONE) 25 MG tablet Take 1 tablet (25 mg total) by mouth daily.   No facility-administered encounter medications on file as of 10/30/2021.    Allergies (verified) Patient has  no known allergies.   History: Past Medical History:  Diagnosis Date   Osteoporosis    Thyroid disease    Past Surgical History:  Procedure Laterality Date   BREAST BIOPSY Left 01/30/2016   CATARACT EXTRACTION Bilateral    COLONOSCOPY     12 yrs ago in [facility]   HEMORRHOID SURGERY     TONSILLECTOMY     Family History  Problem Relation Age of Onset   Hypertension Mother    Cancer Father        prostate   Prostate cancer Father    Hypertension Brother    Stroke Maternal Grandmother    Heart disease Maternal Grandmother    Cancer Maternal Grandfather        lung   Diabetes Paternal Grandmother    Cancer Paternal Grandfather        prostate   Prostate cancer Paternal Grandfather    Colon cancer Paternal Aunt    Colon polyps Neg Hx    Esophageal cancer Neg Hx    Rectal cancer Neg Hx    Stomach cancer Neg Hx    Social History   Socioeconomic History   Marital status: Widowed    Spouse name: Not on file   Number of children: Not on file   Years of education: Not on file   Highest education level: Not on file  Occupational History   Occupation: part time [employer]  Tobacco Use   Smoking status: Never   Smokeless tobacco: Never  Vaping Use   Vaping Use: Never used  Substance  and Sexual Activity   Alcohol use: Yes    Comment: wine daily 1-2 glasses a dat    Drug use: No   Sexual activity: Yes    Partners: Male  Other Topics Concern   Not on file  Social History Narrative   Widowed, lives by herself    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   Regular exercise: no   Caffeine use: daily      Has step children   Social Determinants of Radio broadcast assistant Strain: Not on file  Food Insecurity: Not on file  Transportation Needs: Not on file  Physical Activity: Not on file  Stress: Not on file  Social Connections: Not on file    Tobacco Counseling Counseling given: Not Answered   Clinical Intake:                 Diabetic?***          Activities of Daily Living In your present state of health, do you have any difficulty performing the following activities: 05/12/2021  Hearing? N  Vision? N  Difficulty concentrating or making decisions? N  Walking or climbing stairs? N  Dressing or bathing? N  Doing errands, shopping? N  Some recent data might be hidden    Patient Care Team: Colon Branch, MD as PCP - General (Internal Medicine) Bettina Gavia Hilton Cork, MD as PCP - Cardiology (Cardiology) Armbruster, Carlota Raspberry, MD as Consulting Physician (Gastroenterology) [place], Heather, Bluebell (Optometry)  Indicate any recent Medical Services you may have received from other than [facility] providers in the past year (date may be approximate).     Assessment:   This is a routine wellness examination for Linda Gay.  Hearing/Vision screen No results found.  Dietary issues and exercise activities discussed:     Goals Addressed   None    Depression Screen PHQ 2/9 Scores 09/11/2021 12/14/2020 05/13/2020 01/06/2020 01/29/2018 12/22/2014 09/04/2013  PHQ - 2 Score 0 0 0 2 0 0 0  PHQ- 9 Score - - 3 8 - - -    Fall Risk Fall Risk  09/11/2021 05/12/2021 12/14/2020 05/13/2020 01/06/2020  Falls in the past year? 0 1 0 0 0  Number falls in past yr: 0 0 0 0 0  Injury with Fall? 0 0 0 0 0  Follow up Falls evaluation completed Falls evaluation completed - Falls evaluation completed Falls evaluation completed    FALL RISK PREVENTION PERTAINING TO THE HOME:  Any stairs in or around the home? {YES/NO:21197} If so, are there any without handrails? {YES/NO:21197} Home free of loose throw rugs in walkways, pet beds, electrical cords, etc? {YES/NO:21197} Adequate lighting in your home to reduce risk of falls? {YES/NO:21197}  ASSISTIVE DEVICES UTILIZED TO PREVENT FALLS:  Life alert? {YES/NO:21197} Use of a cane, walker or w/c? {YES/NO:21197} Grab bars in the bathroom? {YES/NO:21197} Shower chair or bench in shower? {YES/NO:21197} Elevated toilet seat or a  handicapped toilet? {YES/NO:21197}  TIMED UP AND GO:  Was the test performed? {YES/NO:21197}.  Length of time to ambulate 10 feet: *** sec.   {Appearance of [id number]  Cognitive Function:        Immunizations Immunization History  Administered Date(s) Administered   Fluad Quad(high Dose 65+) 09/11/2021   Influenza Whole 09/14/2010, 09/07/2011   Influenza, High Dose Seasonal PF 09/16/2020   Influenza, Quadrivalent, Recombinant, Inj, Pf 09/16/2017   Influenza,inj,Quad PF,6+ Mos 09/04/2013, 09/18/2014, 12/27/2015, 11/12/2018   Influenza-Unspecified 09/29/2016, 09/03/2019   PFIZER(Purple Top)SARS-COV-2 Vaccination 02/05/2020, 03/02/2020, 10/10/2020  Pension scheme manager 47yrs & up 09/11/2021   Pneumococcal Conjugate-13 10/12/2019   Pneumococcal Polysaccharide-23 11/11/2020   Td 10/05/2010   Tdap 09/15/2017   Zoster, Live 05/10/2016    TDAP status: Up to date  Flu Vaccine status: Up to date  Pneumococcal vaccine status: Up to date  Covid-19 vaccine status: Completed vaccines  Qualifies for Shingles Vaccine? Yes   Zostavax completed Yes   Shingrix Completed?: No.    Education has been provided regarding the importance of this vaccine. Patient has been advised to call insurance company to determine out of pocket expense if they have not yet received this vaccine. Advised may also receive vaccine at local pharmacy or Health Dept. Verbalized acceptance and understanding.  Screening Tests Health Maintenance  Topic Date Due   Zoster Vaccines- Shingrix (1 of 2) Never done   MAMMOGRAM  01/30/2023   DEXA SCAN  10/24/2023   TETANUS/TDAP  09/16/2027   COLONOSCOPY (Pts 45-73yrs Insurance coverage will need to be confirmed)  07/30/2029   Pneumonia Vaccine 75+ Years old  Completed   INFLUENZA VACCINE  Completed   COVID-19 Vaccine  Completed   Hepatitis C Screening  Completed   HPV VACCINES  Aged Out    Health Maintenance  Health Maintenance Due   Topic Date Due   Zoster Vaccines- Shingrix (1 of 2) Never done    Colorectal cancer screening: Type of screening: Colonoscopy. Completed 07/31/2019. Repeat every 10 years  Mammogram status: Completed bilateral 01/30/2021. Repeat every year  Bone Density status: Completed 10/23/2021. Results reflect: Bone density results: OSTEOPENIA. Repeat every 2 years.  Lung Cancer Screening: (Low Dose CT Chest recommended if Age 56-80 years, 30 pack-year currently smoking OR have quit w/in 15years.) does not qualify.     Additional Screening:  Hepatitis C Screening: Completed 12/27/2015  Vision Screening: Recommended annual ophthalmology exams for early detection of glaucoma and other disorders of the eye. Is the patient up to date with their annual eye exam?  {YES/NO:21197} Who is the provider or what is the name of the office in which the patient attends annual eye exams? *** If pt is not established with a provider, would they like to be referred to a provider to establish care? {YES/NO:21197}.   Dental Screening: Recommended annual dental exams for proper oral hygiene  Community Resource Referral / Chronic Care Management: CRR required this visit?  {YES/NO:21197}  CCM required this visit?  {YES/NO:21197}     Plan:     I have personally reviewed and noted the following in the patient's chart:   Medical and social history Use of alcohol, tobacco or illicit drugs  Current medications and supplements including opioid prescriptions. {Opioid Prescriptions:[phone]} Functional ability and status Nutritional status Physical activity Advanced directives List of other physicians Hospitalizations, surgeries, and ER visits in previous 12 months Vitals Screenings to include cognitive, depression, and falls Referrals and appointments  In addition, I have reviewed and discussed with patient certain preventive protocols, quality metrics, and best practice recommendations. A written  personalized care plan for preventive services as well as general preventive health recommendations were provided to patient.   Due to this being a telephonic visit, the after visit summary with patients personalized plan was offered to patient via mail or my-chart. ***Patient declined at this time./ Patient would like to access on my-chart/ per request, patient was mailed a copy of AVS./ Patient preferred to pick up at office at next visit.   Marta Antu, LPN   [id number]  Nurse Health Advisor  Nurse Notes: ***

## 2021-11-01 ENCOUNTER — Other Ambulatory Visit: Payer: Self-pay | Admitting: Internal Medicine

## 2021-11-02 ENCOUNTER — Ambulatory Visit (INDEPENDENT_AMBULATORY_CARE_PROVIDER_SITE_OTHER): Payer: Medicare Other

## 2021-11-02 VITALS — Ht 63.0 in | Wt 129.0 lb

## 2021-11-02 DIAGNOSIS — Z0000 Encounter for general adult medical examination without abnormal findings: Secondary | ICD-10-CM | POA: Diagnosis not present

## 2021-11-02 NOTE — Progress Notes (Addendum)
Subjective:   Linda Gay is a 65 y.o. female who presents for an Initial Medicare Annual Wellness Visit.  I connected with Abraham today by telephone and verified that I am speaking with the correct person using two identifiers. Location patient: home Location provider: work Persons participating in the virtual visit: patient, Marine scientist.    I discussed the limitations, risks, security and privacy concerns of performing an evaluation and management service by telephone and the availability of in person appointments. I also discussed with the patient that there may be a patient responsible charge related to this service. The patient expressed understanding and verbally consented to this telephonic visit.    Interactive audio and video telecommunications were attempted between this provider and patient, however failed, due to patient having technical difficulties OR patient did not have access to video capability.  We continued and completed visit with audio only.  Some vital signs may be absent or patient reported.   Time Spent with patient on telephone encounter: 20 minutes   Review of Systems     Cardiac Risk Factors include: advanced age (>47men, >2 women);hypertension     Objective:    Today's Vitals   11/02/21 1540  Weight: 129 lb (58.5 kg)  Height: 5\' 3"  (1.6 m)   Body mass index is 22.85 kg/m.  Advanced Directives 11/02/2021  Does Patient Have a Medical Advance Directive? No  Would patient like information on creating a medical advance directive? Yes (MAU/Ambulatory/Procedural Areas - Information given)    Current Medications (verified) Outpatient Encounter Medications as of 11/02/2021  Medication Sig   ALPRAZolam (XANAX) 0.5 MG tablet TAKE 1 TABLET(0.5 MG) BY MOUTH AT BEDTIME AS NEEDED FOR ANXIETY   amLODipine (NORVASC) 5 MG tablet TAKE 1 TABLET(5 MG) BY MOUTH DAILY   levothyroxine (SYNTHROID) 112 MCG tablet TAKE 1 TABLET(112 MCG) BY MOUTH DAILY BEFORE BREAKFAST    COVID-19 mRNA bivalent vaccine, Pfizer, injection Inject into the muscle. (Patient not taking: Reported on 11/02/2021)   spironolactone (ALDACTONE) 25 MG tablet Take 1 tablet (25 mg total) by mouth daily.   No facility-administered encounter medications on file as of 11/02/2021.    Allergies (verified) Patient has no known allergies.   History: Past Medical History:  Diagnosis Date   Osteoporosis    Thyroid disease    Past Surgical History:  Procedure Laterality Date   BREAST BIOPSY Left 01/30/2016   CATARACT EXTRACTION Bilateral    COLONOSCOPY     12 yrs ago in [place] [facility]   HEMORRHOID SURGERY     TONSILLECTOMY     Family History  Problem Relation Age of Onset   Hypertension Mother    Cancer Father        prostate   Prostate cancer Father    Hypertension Brother    Stroke Maternal Grandmother    Heart disease Maternal Grandmother    Cancer Maternal Grandfather        lung   Diabetes Paternal Grandmother    Cancer Paternal Grandfather        prostate   Prostate cancer Paternal Grandfather    Colon cancer Paternal Aunt    Colon polyps Neg Hx    Esophageal cancer Neg Hx    Rectal cancer Neg Hx    Stomach cancer Neg Hx    Social History   Socioeconomic History   Marital status: Widowed    Spouse name: Not on file   Number of children: Not on file   Years of education: Not  on file   Highest education level: Not on file  Occupational History   Occupation: part time [employer]  Tobacco Use   Smoking status: Never   Smokeless tobacco: Never  Vaping Use   Vaping Use: Never used  Substance and Sexual Activity   Alcohol use: Yes    Comment: wine daily 1-2 glasses a dat    Drug use: No   Sexual activity: Yes    Partners: Male  Other Topics Concern   Not on file  Social History Narrative   Widowed, lives by herself    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   Regular exercise: no   Caffeine use: daily      Has step children   Social Determinants of Systems developer Strain: Low Risk    Difficulty of Paying Living Expenses: Not hard at all  Food Insecurity: No Food Insecurity   Worried About Charity fundraiser in the Last Year: Never true   Arboriculturist in the Last Year: Never true  Transportation Needs: No Transportation Needs   Lack of Transportation (Medical): No   Lack of Transportation (Non-Medical): No  Physical Activity: Inactive   Days of Exercise per Week: 0 days   Minutes of Exercise per Session: 0 min  Stress: No Stress Concern Present   Feeling of Stress : Not at all  Social Connections: Socially Isolated   Frequency of Communication with Friends and Family: More than three times a week   Frequency of Social Gatherings with Friends and Family: More than three times a week   Attends Religious Services: Never   Marine scientist or Organizations: No   Attends Archivist Meetings: Never   Marital Status: Widowed    Tobacco Counseling Counseling given: Not Answered   Clinical Intake:  Pre-visit preparation completed: Yes        BMI - recorded: 22.85 Nutritional Status: BMI of 19-24  Normal Nutritional Risks: None  How often do you need to have someone help you when you read instructions, pamphlets, or other written materials from your doctor or pharmacy?: 1 - Never  Diabetic?No  Interpreter Needed?: No  Information entered by :: Caroleen Hamman LPN   Activities of Daily Living In your present state of health, do you have any difficulty performing the following activities: 11/02/2021 05/12/2021  Hearing? N N  Vision? N N  Difficulty concentrating or making decisions? N N  Walking or climbing stairs? N N  Dressing or bathing? N N  Doing errands, shopping? N N  Preparing Food and eating ? N -  Using the Toilet? N -  In the past six months, have you accidently leaked urine? N -  Do you have problems with loss of bowel control? N -  Managing your Medications? N -  Managing your Finances? N -   Housekeeping or managing your Housekeeping? N -  Some recent data might be hidden    Patient Care Team: Colon Branch, MD as PCP - General (Internal Medicine) Bettina Gavia Hilton Cork, MD as PCP - Cardiology (Cardiology) Armbruster, Carlota Raspberry, MD as Consulting Physician (Gastroenterology) [place], Heather, Bradley (Optometry)  Indicate any recent Medical Services you may have received from other than [facility] providers in the past year (date may be approximate).     Assessment:   This is a routine wellness examination for Chaslyn.  Hearing/Vision screen Hearing Screening - Comments:: No issues Vision Screening - Comments:: Last eye exam-10/2021-Dr.  Woods  Dietary issues and exercise activities discussed: Current Exercise Habits: The patient does not participate in regular exercise at present, Exercise limited by: None identified   Goals Addressed             This Visit's Progress    Patient Stated       Increase activity & drink more water       Depression Screen PHQ 2/9 Scores 11/02/2021 09/11/2021 12/14/2020 05/13/2020 01/06/2020 01/29/2018 12/22/2014  PHQ - 2 Score 0 0 0 0 2 0 0  PHQ- 9 Score - - - 3 8 - -    Fall Risk Fall Risk  11/02/2021 09/11/2021 05/12/2021 12/14/2020 05/13/2020  Falls in the past year? 0 0 1 0 0  Number falls in past yr: 0 0 0 0 0  Injury with Fall? 0 0 0 0 0  Follow up Falls prevention discussed Falls evaluation completed Falls evaluation completed - Falls evaluation completed    FALL RISK PREVENTION PERTAINING TO THE HOME:  Any stairs in or around the home? Yes  If so, are there any without handrails? No  Home free of loose throw rugs in walkways, pet beds, electrical cords, etc? Yes  Adequate lighting in your home to reduce risk of falls? Yes   ASSISTIVE DEVICES UTILIZED TO PREVENT FALLS:  Life alert? No  Use of a cane, walker or w/c? No  Grab bars in the bathroom? No  Shower chair or bench in shower? No  Elevated toilet seat or a handicapped toilet? No   TIMED  UP AND GO:  Was the test performed? No . Phone visit   Cognitive Function:Normal cognitive status assessed by this Nurse Health Advisor. No abnormalities found.          Immunizations Immunization History  Administered Date(s) Administered   Fluad Quad(high Dose 65+) 09/11/2021   Influenza Whole 09/14/2010, 09/07/2011   Influenza, High Dose Seasonal PF 09/16/2020   Influenza, Quadrivalent, Recombinant, Inj, Pf 09/16/2017   Influenza,inj,Quad PF,6+ Mos 09/04/2013, 09/18/2014, 12/27/2015, 11/12/2018   Influenza-Unspecified 09/29/2016, 09/03/2019   PFIZER(Purple Top)SARS-COV-2 Vaccination 02/05/2020, 03/02/2020, 10/10/2020   Pfizer Covid-19 Vaccine Bivalent Booster 39yrs & up 09/11/2021   Pneumococcal Conjugate-13 10/12/2019   Pneumococcal Polysaccharide-23 11/11/2020   Td 10/05/2010   Tdap 09/15/2017   Zoster, Live 05/10/2016    TDAP status: Up to date  Flu Vaccine status: Up to date  Pneumococcal vaccine status: Up to date  Covid-19 vaccine status: Completed vaccines  Qualifies for Shingles Vaccine? Yes   Zostavax completed Yes   Shingrix Completed?: No.    Education has been provided regarding the importance of this vaccine. Patient has been advised to call insurance company to determine out of pocket expense if they have not yet received this vaccine. Advised may also receive vaccine at local pharmacy or Health Dept. Verbalized acceptance and understanding.  Screening Tests Health Maintenance  Topic Date Due   Zoster Vaccines- Shingrix (1 of 2) Never done   MAMMOGRAM  01/30/2023   DEXA SCAN  10/24/2023   TETANUS/TDAP  09/16/2027   COLONOSCOPY (Pts 45-67yrs Insurance coverage will need to be confirmed)  07/30/2029   Pneumonia Vaccine 50+ Years old  Completed   INFLUENZA VACCINE  Completed   COVID-19 Vaccine  Completed   Hepatitis C Screening  Completed   HPV VACCINES  Aged Out    Health Maintenance  Health Maintenance Due  Topic Date Due   Zoster Vaccines-  Shingrix (1 of 2) Never done  Colorectal cancer screening: Type of screening: Colonoscopy. Completed 07/31/2019. Repeat every 10 years  Mammogram status: Completed bilateral 01/30/2021. Repeat every year  Bone Density status: Completed 10/23/2021. Results reflect: Bone density results: OSTEOPENIA. Repeat every 2 years.  Lung Cancer Screening: (Low Dose CT Chest recommended if Age 32-80 years, 30 pack-year currently smoking OR have quit w/in 15years.) does not qualify.     Additional Screening:  Hepatitis C Screening: Completed 12/27/2015  Vision Screening: Recommended annual ophthalmology exams for early detection of glaucoma and other disorders of the eye. Is the patient up to date with their annual eye exam?  Yes  Who is the provider or what is the name of the office in which the patient attends annual eye exams? Dr. Sherral Hammers   Dental Screening: Recommended annual dental exams for proper oral hygiene  Community Resource Referral / Chronic Care Management: CRR required this visit?  No   CCM required this visit?  No      Plan:     I have personally reviewed and noted the following in the patient's chart:   Medical and social history Use of alcohol, tobacco or illicit drugs  Current medications and supplements including opioid prescriptions. Patient is not currently taking opioid prescriptions. Functional ability and status Nutritional status Physical activity Advanced directives List of other physicians Hospitalizations, surgeries, and ER visits in previous 12 months Vitals Screenings to include cognitive, depression, and falls Referrals and appointments  In addition, I have reviewed and discussed with patient certain preventive protocols, quality metrics, and best practice recommendations. A written personalized care plan for preventive services as well as general preventive health recommendations were provided to patient.   Due to this being a telephonic visit, the  after visit summary with patients personalized plan was offered to patient via mail or my-chart.  Patient would like to access on my-chart.  Marta Antu, LPN   [id number]  Nurse Health Advisor  Nurse Notes: None   I have reviewed and agree with Health Coaches documentation.  Kathlene November, MD

## 2021-11-02 NOTE — Patient Instructions (Signed)
Linda Gay , Thank you for taking time to complete your Medicare Wellness Visit. I appreciate your ongoing commitment to your health goals. Please review the following plan we discussed and let me know if I can assist you in the future.   Screening recommendations/referrals: Colonoscopy: Completed 07/31/2019-Due 07/30/2029 Mammogram: Completed 01/30/2021-Due 01/30/2022 Bone Density: Completed 10/23/2021-Due 10/24/2023 Recommended yearly ophthalmology/optometry visit for glaucoma screening and checkup Recommended yearly dental visit for hygiene and checkup  Vaccinations: Influenza vaccine: Up to date Pneumococcal vaccine: Up to date Tdap vaccine: Up to date Shingles vaccine: Discuss with pharmacy   Covid-19:Up to date  Advanced directives: Information mailed today  Conditions/risks identified: See problem list  Next appointment: Follow up in one year for your annual wellness visit 11/05/2022 @ 2:20(phone visit)   Preventive Care 65 Years and Older, Female Preventive care refers to lifestyle choices and visits with your health care provider that can promote health and wellness. What does preventive care include? A yearly physical exam. This is also called an annual well check. Dental exams once or twice a year. Routine eye exams. Ask your health care provider how often you should have your eyes checked. Personal lifestyle choices, including: Daily care of your teeth and gums. Regular physical activity. Eating a healthy diet. Avoiding tobacco and drug use. Limiting alcohol use. Practicing safe sex. Taking low-dose aspirin every day. Taking vitamin and mineral supplements as recommended by your health care provider. What happens during an annual well check? The services and screenings done by your health care provider during your annual well check will depend on your age, overall health, lifestyle risk factors, and family history of disease. Counseling  Your health care provider may  ask you questions about your: Alcohol use. Tobacco use. Drug use. Emotional well-being. Home and relationship well-being. Sexual activity. Eating habits. History of falls. Memory and ability to understand (cognition). Work and work Statistician. Reproductive health. Screening  You may have the following tests or measurements: Height, weight, and BMI. Blood pressure. Lipid and cholesterol levels. These may be checked every 5 years, or more frequently if you are over 86 years old. Skin check. Lung cancer screening. You may have this screening every year starting at age 70 if you have a 30-pack-year history of smoking and currently smoke or have quit within the past 15 years. Fecal occult blood test (FOBT) of the stool. You may have this test every year starting at age 19. Flexible sigmoidoscopy or colonoscopy. You may have a sigmoidoscopy every 5 years or a colonoscopy every 10 years starting at age 12. Hepatitis C blood test. Hepatitis B blood test. Sexually transmitted disease (STD) testing. Diabetes screening. This is done by checking your blood sugar (glucose) after you have not eaten for a while (fasting). You may have this done every 1-3 years. Bone density scan. This is done to screen for osteoporosis. You may have this done starting at age 35. Mammogram. This may be done every 1-2 years. Talk to your health care provider about how often you should have regular mammograms. Talk with your health care provider about your test results, treatment options, and if necessary, the need for more tests. Vaccines  Your health care provider may recommend certain vaccines, such as: Influenza vaccine. This is recommended every year. Tetanus, diphtheria, and acellular pertussis (Tdap, Td) vaccine. You may need a Td booster every 10 years. Zoster vaccine. You may need this after age 92. Pneumococcal 13-valent conjugate (PCV13) vaccine. One dose is recommended after age 35. Pneumococcal  polysaccharide (PPSV23) vaccine. One dose is recommended after age 30. Talk to your health care provider about which screenings and vaccines you need and how often you need them. This information is not intended to replace advice given to you by your health care provider. Make sure you discuss any questions you have with your health care provider. Document Released: 12/16/2015 Document Revised: 08/08/2016 Document Reviewed: 09/20/2015 Elsevier Interactive Patient Education  2017 [employer] Prevention in the Home Falls can cause injuries. They can happen to people of all ages. There are many things you can do to make your home safe and to help prevent falls. What can I do on the outside of my home? Regularly fix the edges of walkways and driveways and fix any cracks. Remove anything that might make you trip as you walk through a door, such as a raised step or threshold. Trim any bushes or trees on the path to your home. Use bright outdoor lighting. Clear any walking paths of anything that might make someone trip, such as rocks or tools. Regularly check to see if handrails are loose or broken. Make sure that both sides of any steps have handrails. Any raised decks and porches should have guardrails on the edges. Have any leaves, snow, or ice cleared regularly. Use sand or salt on walking paths during winter. Clean up any spills in your garage right away. This includes oil or grease spills. What can I do in the bathroom? Use night lights. Install grab bars by the toilet and in the tub and shower. Do not use towel bars as grab bars. Use non-skid mats or decals in the tub or shower. If you need to sit down in the shower, use a plastic, non-slip stool. Keep the floor dry. Clean up any water that spills on the floor as soon as it happens. Remove soap buildup in the tub or shower regularly. Attach bath mats securely with double-sided non-slip rug tape. Do not have throw rugs and other  things on the floor that can make you trip. What can I do in the bedroom? Use night lights. Make sure that you have a light by your bed that is easy to reach. Do not use any sheets or blankets that are too big for your bed. They should not hang down onto the floor. Have a firm chair that has side arms. You can use this for support while you get dressed. Do not have throw rugs and other things on the floor that can make you trip. What can I do in the kitchen? Clean up any spills right away. Avoid walking on wet floors. Keep items that you use a lot in easy-to-reach places. If you need to reach something above you, use a strong step stool that has a grab bar. Keep electrical cords out of the way. Do not use floor polish or wax that makes floors slippery. If you must use wax, use non-skid floor wax. Do not have throw rugs and other things on the floor that can make you trip. What can I do with my stairs? Do not leave any items on the stairs. Make sure that there are handrails on both sides of the stairs and use them. Fix handrails that are broken or loose. Make sure that handrails are as long as the stairways. Check any carpeting to make sure that it is firmly attached to the stairs. Fix any carpet that is loose or worn. Avoid having throw rugs at the top or  bottom of the stairs. If you do have throw rugs, attach them to the floor with carpet tape. Make sure that you have a light switch at the top of the stairs and the bottom of the stairs. If you do not have them, ask someone to add them for you. What else can I do to help prevent falls? Wear shoes that: Do not have high heels. Have rubber bottoms. Are comfortable and fit you well. Are closed at the toe. Do not wear sandals. If you use a stepladder: Make sure that it is fully opened. Do not climb a closed stepladder. Make sure that both sides of the stepladder are locked into place. Ask someone to hold it for you, if possible. Clearly  mark and make sure that you can see: Any grab bars or handrails. First and last steps. Where the edge of each step is. Use tools that help you move around (mobility aids) if they are needed. These include: Canes. Walkers. Scooters. Crutches. Turn on the lights when you go into a dark area. Replace any light bulbs as soon as they burn out. Set up your furniture so you have a clear path. Avoid moving your furniture around. If any of your floors are uneven, fix them. If there are any pets around you, be aware of where they are. Review your medicines with your doctor. Some medicines can make you feel dizzy. This can increase your chance of falling. Ask your doctor what other things that you can do to help prevent falls. This information is not intended to replace advice given to you by your health care provider. Make sure you discuss any questions you have with your health care provider. Document Released: 09/15/2009 Document Revised: 04/26/2016 Document Reviewed: 12/24/2014 Elsevier Interactive Patient Education  2017 [employer].

## 2021-12-22 ENCOUNTER — Encounter (HOSPITAL_BASED_OUTPATIENT_CLINIC_OR_DEPARTMENT_OTHER): Payer: Self-pay | Admitting: *Deleted

## 2021-12-22 ENCOUNTER — Other Ambulatory Visit: Payer: Self-pay

## 2021-12-22 ENCOUNTER — Emergency Department (HOSPITAL_BASED_OUTPATIENT_CLINIC_OR_DEPARTMENT_OTHER): Payer: Medicare Other

## 2021-12-22 ENCOUNTER — Emergency Department (HOSPITAL_BASED_OUTPATIENT_CLINIC_OR_DEPARTMENT_OTHER)
Admission: EM | Admit: 2021-12-22 | Discharge: 2021-12-23 | Disposition: A | Discharge status: Home / Self Care | Payer: Medicare Other | Attending: Emergency Medicine | Admitting: Emergency Medicine

## 2021-12-22 DIAGNOSIS — K5730 Diverticulosis of large intestine without perforation or abscess without bleeding: Secondary | ICD-10-CM | POA: Insufficient documentation

## 2021-12-22 DIAGNOSIS — M545 Low back pain, unspecified: Secondary | ICD-10-CM | POA: Insufficient documentation

## 2021-12-22 DIAGNOSIS — Z79899 Other long term (current) drug therapy: Secondary | ICD-10-CM | POA: Diagnosis not present

## 2021-12-22 DIAGNOSIS — M5489 Other dorsalgia: Secondary | ICD-10-CM

## 2021-12-22 DIAGNOSIS — K7689 Other specified diseases of liver: Secondary | ICD-10-CM | POA: Diagnosis not present

## 2021-12-22 DIAGNOSIS — E039 Hypothyroidism, unspecified: Secondary | ICD-10-CM | POA: Insufficient documentation

## 2021-12-22 LAB — COMPREHENSIVE METABOLIC PANEL
ALT: 15 U/L (ref 0–44)
AST: 18 U/L (ref 15–41)
Albumin: 4.1 g/dL (ref 3.5–5.0)
Alkaline Phosphatase: 50 U/L (ref 38–126)
Anion gap: 10 (ref 5–15)
BUN: 16 mg/dL (ref 8–23)
CO2: 22 mmol/L (ref 22–32)
Calcium: 9.1 mg/dL (ref 8.9–10.3)
Chloride: 102 mmol/L (ref 98–111)
Creatinine, Ser: 0.64 mg/dL (ref 0.44–1.00)
GFR, Estimated: >60 mL/min (ref >60)
Glucose, Bld: 192 mg/dL — ABNORMAL HIGH (ref 70–99)
Potassium: 3.7 mmol/L (ref 3.5–5.1)
Sodium: 134 mmol/L — ABNORMAL LOW (ref 135–145)
Total Bilirubin: 0.7 mg/dL (ref 0.3–1.2)
Total Protein: 6.6 g/dL (ref 6.5–8.1)

## 2021-12-22 LAB — URINALYSIS, MICROSCOPIC (REFLEX): WBC, UA: NONE SEEN WBC/hpf (ref 0–5)

## 2021-12-22 LAB — CBC WITH DIFFERENTIAL/PLATELET
Abs Immature Granulocytes: 0.02 10*3/uL (ref 0.00–0.07)
Basophils Absolute: 0 10*3/uL (ref 0.0–0.1)
Basophils Relative: 0 %
Eosinophils Absolute: 0 10*3/uL (ref 0.0–0.5)
Eosinophils Relative: 0 %
HCT: 33.9 % — ABNORMAL LOW (ref 36.0–46.0)
Hemoglobin: 12.1 g/dL (ref 12.0–15.0)
Immature Granulocytes: 0 %
Lymphocytes Relative: 7 %
Lymphs Abs: 0.4 10*3/uL — ABNORMAL LOW (ref 0.7–4.0)
MCH: 30.7 pg (ref 26.0–34.0)
MCHC: 35.7 g/dL (ref 30.0–36.0)
MCV: 86 fL (ref 80.0–100.0)
Monocytes Absolute: 0.1 10*3/uL (ref 0.1–1.0)
Monocytes Relative: 2 %
Neutro Abs: 4.5 10*3/uL (ref 1.7–7.7)
Neutrophils Relative %: 91 %
Platelets: 209 10*3/uL (ref 150–400)
RBC: 3.94 MIL/uL (ref 3.87–5.11)
RDW: 12.2 % (ref 11.5–15.5)
WBC: 5 10*3/uL (ref 4.0–10.5)
nRBC: 0 % (ref 0.0–0.2)

## 2021-12-22 LAB — URINALYSIS, ROUTINE W REFLEX MICROSCOPIC
Bilirubin Urine: NEGATIVE
Glucose, UA: NEGATIVE mg/dL
Ketones, ur: 40 mg/dL — AB
Leukocytes,Ua: NEGATIVE
Nitrite: NEGATIVE
Protein, ur: NEGATIVE mg/dL
Specific Gravity, Urine: >=1.03 (ref 1.005–1.030)
pH: 6.5 (ref 5.0–8.0)

## 2021-12-22 LAB — LIPASE, BLOOD: Lipase: 26 U/L (ref 11–51)

## 2021-12-22 MED ORDER — NAPROXEN 500 MG PO TABS: 500.0000 mg | ORAL_TABLET | Freq: Two times a day (BID) | ORAL | 0 refills | Status: DC

## 2021-12-22 MED ORDER — LIDOCAINE 5 % EX PTCH: 1.0000 | MEDICATED_PATCH | CUTANEOUS | 0 refills | Status: DC

## 2021-12-22 MED ORDER — KETOROLAC TROMETHAMINE 30 MG/ML IJ SOLN
30.0000 mg | Freq: Once | INTRAMUSCULAR | Status: AC
Start: 2021-12-22 — End: 2021-12-22
  Administered 2021-12-22: 30 mg via INTRAVENOUS
  Filled 2021-12-22: qty 1

## 2021-12-22 MED ORDER — MORPHINE SULFATE (PF) 4 MG/ML IV SOLN: 4.0000 mg | Freq: Once | INTRAVENOUS | Status: DC

## 2021-12-22 MED ORDER — MORPHINE SULFATE (PF) 2 MG/ML IV SOLN
2.0000 mg | Freq: Once | INTRAVENOUS | Status: AC
  Administered 2021-12-22: 2 mg via INTRAVENOUS
  Filled 2021-12-22: qty 1

## 2021-12-22 MED ORDER — METHOCARBAMOL 500 MG PO TABS: 500.0000 mg | ORAL_TABLET | Freq: Two times a day (BID) | ORAL | 0 refills | Status: AC

## 2021-12-22 NOTE — ED Notes (Signed)
ED Provider at bedside. 

## 2021-12-22 NOTE — ED Provider Notes (Signed)
[facility] Provider Note   CSN: [id number] Arrival date & time: 12/22/21  2024     History  Chief Complaint  Patient presents with   Flank Pain    Linda Gay is a 66 y.o. female with pmh of hypothyroidism, osteoporosis and aortic regurgitation presenting with left-sided back pain that is been going on for the past few weeks.  It acutely worsened today and it made her feel nauseated due to its severity.  Was given a muscle relaxant by a family member and this did not help her.  Denies any urinary symptoms, no history of kidney stone.  No abdominal surgeries.  Is still able to ambulate and denies any numbness or tingling.  No known injuries.  No fever or chills.  The pain is worse with palpation of the left lower back and certain motions.   Home Medications Prior to Admission medications   Medication Sig Start Date End Date Taking? Authorizing Provider  amLODipine (NORVASC) 5 MG tablet TAKE 1 TABLET(5 MG) BY MOUTH DAILY 11/02/21  Yes Colon Branch, MD  levothyroxine (SYNTHROID) 112 MCG tablet TAKE 1 TABLET(112 MCG) BY MOUTH DAILY BEFORE BREAKFAST 10/20/21  Yes Colon Branch, MD  spironolactone (ALDACTONE) 25 MG tablet Take 1 tablet (25 mg total) by mouth daily. 08/03/21 12/22/21 Yes Richardo Priest, MD  ALPRAZolam Duanne Moron) 0.5 MG tablet TAKE 1 TABLET(0.5 MG) BY MOUTH AT BEDTIME AS NEEDED FOR ANXIETY 06/13/21   Colon Branch, MD  COVID-19 mRNA bivalent vaccine, Pfizer, injection Inject into the muscle. Patient not taking: Reported on 11/02/2021 09/11/21   Carlyle Basques, MD      Allergies    Patient has no known allergies.    Review of Systems   Review of Systems  Genitourinary:  Positive for flank pain.  As per HPI Physical Exam Updated Vital Signs BP 120/61 (BP Location: Right Arm)    Pulse 66    Temp 97.7 F (36.5 C) (Oral)    Resp 16    Ht 5\' 3"  (1.6 m)    Wt 58.5 kg    SpO2 97%    BMI 22.85 kg/m  Physical Exam Vitals and nursing note reviewed.   Constitutional:      General: She is not in acute distress.    Appearance: Normal appearance.  HENT:     Head: Normocephalic and atraumatic.  Eyes:     General: No scleral icterus.    Conjunctiva/sclera: Conjunctivae normal.  Pulmonary:     Effort: Pulmonary effort is normal. No respiratory distress.  Abdominal:     General: Abdomen is flat.     Palpations: Abdomen is soft.     Tenderness: There is no abdominal tenderness. There is no right CVA tenderness or left CVA tenderness.  Musculoskeletal:        General: Tenderness (Left-sided paraspinals of the lumbar spine) present. No swelling, deformity or signs of injury.  Skin:    General: Skin is warm and dry.     Findings: No rash.     Comments: No signs of vesicles or rash consistent with shingles  Neurological:     Mental Status: She is alert.  Psychiatric:        Mood and Affect: Mood normal.    ED Results / Procedures / Treatments   Labs (all labs ordered are listed, but only abnormal results are displayed) Labs Reviewed  URINALYSIS, ROUTINE W REFLEX MICROSCOPIC - Abnormal; Notable for the following components:  Result Value   Hgb urine dipstick TRACE (*)    Ketones, ur 40 (*)    All other components within normal limits  CBC WITH DIFFERENTIAL/PLATELET - Abnormal; Notable for the following components:   HCT 33.9 (*)    Lymphs Abs 0.4 (*)    All other components within normal limits  URINALYSIS, MICROSCOPIC (REFLEX) - Abnormal; Notable for the following components:   Bacteria, UA RARE (*)    All other components within normal limits  LIPASE, BLOOD  COMPREHENSIVE METABOLIC PANEL    EKG None  Radiology CT Renal Stone Study  Result Date: 12/22/2021 CLINICAL DATA:  Left flank pain x3 weeks. EXAM: CT ABDOMEN AND PELVIS WITHOUT CONTRAST TECHNIQUE: Multidetector CT imaging of the abdomen and pelvis was performed following the standard protocol without IV contrast. RADIATION DOSE REDUCTION: This exam was performed  according to the departmental dose-optimization program which includes automated exposure control, adjustment of the mA and/or kV according to patient size and/or use of iterative reconstruction technique. COMPARISON:  None. FINDINGS: Lower chest: No acute abnormality. Hepatobiliary: Cystic appearing areas are seen within the left lobe and posteromedial aspect of the right lobe of the liver. The largest measures approximately 4.5 cm x 3.4 cm. No gallstones are identified. Dilatation of the common bile duct is noted (7.1 mm). Pancreas: Unremarkable. No pancreatic ductal dilatation or surrounding inflammatory changes. Spleen: Normal in size without focal abnormality. Adrenals/Urinary Tract: Adrenal glands are unremarkable. Kidneys are normal, without renal calculi, focal lesion, or hydronephrosis. Bladder is unremarkable. Stomach/Bowel: Stomach is within normal limits. Appendix appears normal. No evidence of bowel wall thickening, distention, or inflammatory changes. Noninflamed diverticula are seen throughout the sigmoid colon. Vascular/Lymphatic: Aortic atherosclerosis. No enlarged abdominal or pelvic lymph nodes. Reproductive: The uterine fundus is enlarged and heterogeneous in appearance. Other: No abdominal wall hernia or abnormality. No abdominopelvic ascites. Musculoskeletal: No acute or significant osseous findings. IMPRESSION: 1. Multiple hepatic cysts, as described above. Correlation with nonemergent hepatic ultrasound is recommended. 2. Dilatation of the common bile duct (7.1 mm) without visualized gallstones or obstructing mass lesions. Correlation with nonemergent right upper quadrant ultrasound is recommended. 3. Sigmoid diverticulosis. 4. Heterogeneous uterine fibroids suspected with the uterine fundus. Recommend further evaluation with pelvic ultrasound. 5. Aortic atherosclerosis. Aortic Atherosclerosis (ICD10-I70.0). Electronically Signed   By: Virgina Norfolk M.D.   On: 12/22/2021 22:59     Procedures Procedures    Medications Ordered in ED Medications - No data to display  ED Course/ Medical Decision Making/ A&P                           Medical Decision Making Amount and/or Complexity of Data Reviewed Labs: ordered. Radiology: ordered.  Risk Prescription drug management.   66 year old female presenting with back pain.  Originally triaged as flank pain however the tenderness is localized to the muscles of the lower back.  She still has full range of motion of all levels of the spine.  CVA tenderness was negative.  Differential includes but is not limited to  nephrolithiasis, pyelonephritis, cholecystitis, muscle strain.  Testing: Blood work with no remarkable findings.  Urinalysis negative for infection or hematuria.  Imaging: CT renal ordered and individually interpreted by me.  I agree with the finding of no kidney stones.  The patient does have gallstones and dilation of the common bile duct to 7.1.  She was also noted to have hepatic cysts.  Patient's abdominal is nontender.  Murphy negative.  I do not believe it is necessary to obtain an emergent right upper quadrant ultrasound.  Disposition: I believe the patient's presentation to be more consistent with a muscle strain.  Noted tension in the paraspinals of the lumbar spine and pain seems to be musculoskeletal in nature.  I we will send the patient home with naproxen, Robaxin and lidocaine patches.  She will have strict return precautions as well is a referral to gastroenterology to discuss the CT findings.  She is agreeable to this plan  Final Clinical Impression(s) / ED Diagnoses Final diagnoses:  Left paraspinal back pain  Hepatic cyst    Rx / DC Orders Results and diagnoses were explained to the patient. Return precautions discussed in full. Patient had no additional questions and expressed complete understanding.   This chart was dictated using voice recognition software.  Despite best efforts to  proofread,  errors can occur which can change the documentation meaning.    Darliss Ridgel 12/23/21 [id number]    Ezequiel Essex, MD 12/24/21 1054

## 2021-12-22 NOTE — Discharge Instructions (Addendum)
I have sent naproxen, Robaxin and lidocaine patches to the pharmacy.  Please use these as prescribed.  You do not have any kidney stones and I believe your pain to be coming from a muscle.  Your scan did show some cysts on your liver, however your liver function is normal.  There is also inflammation in the ducts coming from the gallbladder, these are both things to follow-up with outpatient with gastroenterology.  This is a nonemergent follow-up however I suggest you make an appointment within the next week.

## 2021-12-22 NOTE — ED Triage Notes (Signed)
Left flank pain x 3 weeks. Today the pain got worse. It feels like "slicing".

## 2021-12-22 NOTE — ED Notes (Signed)
Patient returned from CT

## 2021-12-24 ENCOUNTER — Telehealth: Payer: Self-pay | Admitting: Internal Medicine

## 2021-12-24 DIAGNOSIS — R932 Abnormal findings on diagnostic imaging of liver and biliary tract: Secondary | ICD-10-CM

## 2021-12-24 NOTE — Telephone Encounter (Signed)
Patient went to the ER, musculoskeletal back pain, CT however showed the following:  1. Multiple hepatic cysts, as described above. Correlation with nonemergent hepatic ultrasound is recommended. 2. Dilatation of the common bile duct (7.1 mm) without visualized gallstones or obstructing mass lesions. Correlation with nonemergent right upper quadrant ultrasound is recommended.  Please arrange a nonurgent abdominal ultrasound.  DX abnormal CT liver. Let pt know . OV if sxs continue

## 2021-12-25 NOTE — Telephone Encounter (Signed)
Last read by Dorene Grebe at  9:46 AM on 12/25/2021.

## 2021-12-25 NOTE — Telephone Encounter (Signed)
Mychart message sent to Pt. US abdomen ordered.

## 2021-12-25 NOTE — Telephone Encounter (Signed)
[place] scheduled 12/27/21.

## 2021-12-27 ENCOUNTER — Other Ambulatory Visit: Payer: Self-pay

## 2021-12-27 ENCOUNTER — Ambulatory Visit (HOSPITAL_BASED_OUTPATIENT_CLINIC_OR_DEPARTMENT_OTHER)
Admission: RE | Admit: 2021-12-27 | Discharge: 2021-12-27 | Disposition: A | Discharge status: Home / Self Care | Payer: Medicare Other | Source: Ambulatory Visit | Attending: Internal Medicine | Admitting: Internal Medicine

## 2021-12-27 DIAGNOSIS — R932 Abnormal findings on diagnostic imaging of liver and biliary tract: Secondary | ICD-10-CM | POA: Diagnosis present

## 2021-12-28 ENCOUNTER — Telehealth: Payer: Self-pay | Admitting: Internal Medicine

## 2021-12-28 NOTE — Telephone Encounter (Signed)
PDMP okay, Rx sent 

## 2021-12-28 NOTE — Telephone Encounter (Signed)
Requesting: alprazolam 0.5mg   Contract: 01/06/2020 UDS: 05/12/2021 Last Visit: 09/11/2021 Next Visit: 02/08/2022 Last Refill: 06/13/2021 #30 and 5RF  Please Advise

## 2022-02-08 ENCOUNTER — Ambulatory Visit (INDEPENDENT_AMBULATORY_CARE_PROVIDER_SITE_OTHER): Payer: Medicare Other | Admitting: Internal Medicine

## 2022-02-08 ENCOUNTER — Encounter: Payer: Self-pay | Admitting: Internal Medicine

## 2022-02-08 VITALS — BP 126/74 | HR 60 | Temp 98.3°F | Resp 16 | Ht 63.0 in | Wt 127.1 lb

## 2022-02-08 DIAGNOSIS — E039 Hypothyroidism, unspecified: Secondary | ICD-10-CM

## 2022-02-08 DIAGNOSIS — R739 Hyperglycemia, unspecified: Secondary | ICD-10-CM

## 2022-02-08 DIAGNOSIS — Z1231 Encounter for screening mammogram for malignant neoplasm of breast: Secondary | ICD-10-CM | POA: Diagnosis not present

## 2022-02-08 DIAGNOSIS — D259 Leiomyoma of uterus, unspecified: Secondary | ICD-10-CM | POA: Diagnosis not present

## 2022-02-08 DIAGNOSIS — G4700 Insomnia, unspecified: Secondary | ICD-10-CM

## 2022-02-08 DIAGNOSIS — R932 Abnormal findings on diagnostic imaging of liver and biliary tract: Secondary | ICD-10-CM

## 2022-02-08 LAB — HEMOGLOBIN A1C: Hgb A1c MFr Bld: 5.2 % (ref 4.6–6.5)

## 2022-02-08 LAB — TSH: TSH: 1.24 u[IU]/mL (ref 0.35–5.50)

## 2022-02-08 NOTE — Progress Notes (Signed)
? ?Subjective:  ? ? Patient ID: Linda Gay, female    DOB: 1956-05-31, 66 y.o.   MRN: [id number] ? ?DOS:  02/08/2022 ?Type of visit - description: Follow-up ?Routine follow-up ?Went to the ER 12/22/2021 with back pain. ?CT was done, several abnormalities found.  Subsequently had a ultrasound of the liver. ?Also, after the ER visit saw orthopedic surgery. ?Pain is better. ?Today, I also address her chronic medical problems and  preventive care ? ? ?Review of Systems  ?Se above ? ?Past Medical History:  ?Diagnosis Date  ? Osteoporosis   ? Thyroid disease   ? ? ?Past Surgical History:  ?Procedure Laterality Date  ? BREAST BIOPSY Left 01/30/2016  ? CATARACT EXTRACTION Bilateral   ? COLONOSCOPY    ? 12 yrs ago in [place] [facility]  ? HEMORRHOID SURGERY    ? TONSILLECTOMY    ? ?Social History  ? ?Socioeconomic History  ? Marital status: Widowed  ?  Spouse name: Not on file  ? Number of children: Not on file  ? Years of education: Not on file  ? Highest education level: Not on file  ?Occupational History  ? Occupation: part time [employer]  ?Tobacco Use  ? Smoking status: Never  ? Smokeless tobacco: Never  ?Vaping Use  ? Vaping Use: Never used  ?Substance and Sexual Activity  ? Alcohol use: Yes  ?  Comment: wine daily 1-2 glasses a dat   ? Drug use: No  ? Sexual activity: Yes  ?  Partners: Male  ?Other Topics Concern  ? Not on file  ?Social History Narrative  ? Widowed, lives by herself   ? 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  ? Regular exercise: no  ? Caffeine use: daily  ?   ? Has step children  ? ?Social Determinants of Health  ? ?Financial Resource Strain: Low Risk   ? Difficulty of Paying Living Expenses: Not hard at all  ?Food Insecurity: No Food Insecurity  ? Worried About Charity fundraiser in the Last Year: Never true  ? Ran Out of Food in the Last Year: Never true  ?Transportation Needs: No Transportation Needs  ? Lack of Transportation (Medical): No  ? Lack of Transportation (Non-Medical): No  ?Physical Activity: Inactive  ? Days  of Exercise per Week: 0 days  ? Minutes of Exercise per Session: 0 min  ?Stress: No Stress Concern Present  ? Feeling of Stress : Not at all  ?Social Connections: Socially Isolated  ? Frequency of Communication with Friends and Family: More than three times a week  ? Frequency of Social Gatherings with Friends and Family: More than three times a week  ? Attends Religious Services: Never  ? Active Member of Clubs or Organizations: No  ? Attends Archivist Meetings: Never  ? Marital Status: Widowed  ?Intimate Partner Violence: Not At Risk  ? Fear of Current or Ex-Partner: No  ? Emotionally Abused: No  ? Physically Abused: No  ? Sexually Abused: No  ? ? ? ?Current Outpatient Medications  ?Medication Instructions  ? ALPRAZolam (XANAX) 0.5 MG tablet TAKE 1 TABLET(0.5 MG) BY MOUTH AT BEDTIME AS NEEDED FOR ANXIETY  ? amLODipine (NORVASC) 5 MG tablet TAKE 1 TABLET(5 MG) BY MOUTH DAILY  ? levothyroxine (SYNTHROID) 112 MCG tablet TAKE 1 TABLET(112 MCG) BY MOUTH DAILY BEFORE BREAKFAST  ? naproxen (NAPROSYN) 500 mg, Oral, 2 times daily  ? spironolactone (ALDACTONE) 25 mg, Oral, Daily  ? ? ?   ?  Objective:  ? Physical Exam ?BP 126/74 (BP Location: Left Arm, Patient Position: Sitting, Cuff Size: Small)   Pulse 60   Temp 98.3 ?F (36.8 ?C) (Oral)   Resp 16   Ht '5\' 3"'$  (1.6 m)   Wt 127 lb 2 oz (57.7 kg)   SpO2 98%   BMI 22.52 kg/m?  ?General:   ?Well developed, NAD, BMI noted.  ?HEENT:  ?Normocephalic . Face symmetric, atraumatic ?Lungs:  ?CTA B ?Normal respiratory effort, no intercostal retractions, no accessory muscle use. ?Heart: RRR, soft systolic murmur.  ?Abdomen:  ?Not distended, soft, non-tender. No rebound or rigidity.   ?Skin: Not pale. Not jaundice ?Lower extremities: no pretibial edema bilaterally  ?Neurologic:  ?alert & oriented X3.  ?Speech normal, gait appropriate for age and unassisted ?Psych--  ?Cognition and judgment appear intact.  ?Cooperative with normal attention span and concentration.   ?Behavior appropriate. ?No anxious or depressed appearing. ? ?   ?Assessment   ? ?  ?ASSESSMENT(new patient 01-2020, transfer) ?HTN DX 11/2020. ?Hypothyroidism ?Osteoporosis:  07-2019 T score -2.6. s/p fosamax x ~ 18 months, self d/c d/t aches  ?Insomnia ?Aortic regurgitation ? ?PLAN ?HTN: On amlodipine, Aldactone.  Recent BMP okay.  No recent ambulatory BPs, no change. ?Hypothyroidism: On Synthroid, check TSH. ?Hyperglycemia: Check A1c ?Osteopenia: Last T score -2.4, November 2022.  No personal history of fractures, no family history of osteoporosis, not a smoker.  We discussed proactively treat with a medication versus continue vitamin D and physical activity and recheck in 2 years.  Elected to recheck in 2 years. ?Insomnia: Xanax half tablet is taking as needed only, she is not completely satisfied with results, she will let me know if she is interested in changing medication.  We could try Ambien 5 mg. ?Abnormal CT abdomen and pelvis: ?CT showed hepatic cyst and a slightly enlarged bile duct, liver ultrasound was normal. ?Also she has uterine fibroids, denies any vaginal bleeding.  Referred to gynecology. ?RTC 4 m ? ? ? ? ?This visit occurred during the SARS-CoV-2 public health emergency.  Safety protocols were in place, including screening questions prior to the visit, additional usage of staff PPE, and extensive cleaning of exam room while observing appropriate contact time as indicated for disinfecting solutions.  ? ?

## 2022-02-08 NOTE — Patient Instructions (Addendum)
We are referring you for a mammogram ? ?We are referring you to a gynecologist for fibroid tumors ? ?Vaccines I recommend: Shingrix ? ?GO TO THE LAB : Get the blood work   ? ? ?[place], [place] ?Come back for a checkup in 4 months ? ? ? ?"Living will", "Health Care Power of attorney": Advanced care planning ? ?(If you already have a living will or healthcare power of attorney, please bring the copy to be scanned in your chart.) ? ?Advance care planning is a process that supports adults in  understanding and sharing their preferences regarding future medical care.  ? ?The patient's preferences are recorded in documents called Advance Directives.    ?Advanced directives are completed (and can be modified at any time) while the patient is in full mental capacity.  ? ?The documentation should be available at all times to the patient, the family and the healthcare providers.  ?Bring in a copy to be scanned in your chart is an excellent idea and is recommended  ? ?This legal documents direct treatment decision making and/or appoint a surrogate to make the decision if the patient is not capable to do so.  ? ? ?Advance directives can be documented in many types of formats,  documents have names such as:  ?Lliving will  ?Durable power of attorney for healthcare (healthcare proxy or healthcare power of attorney)  ?Combined directives  ?Physician orders for life-sustaining treatment  ?  ?More information at: ? ?[email or web address]  ?

## 2022-02-08 NOTE — Assessment & Plan Note (Signed)
Preventive care: ?-Tdap 2019 ?- PNM 13: 2020.  PNM 23: 2021. ?- Recommend Shingrix ?- COVID vaccines: UTD ?- Flu shot: UTD ?- Referral for mammogram ?- Referring to gynecology ?-[facility]: Last colonoscopy 07-2019, next per GI ?-ACP discussed ?

## 2022-04-09 ENCOUNTER — Ambulatory Visit: Payer: Medicare Other

## 2022-04-09 ENCOUNTER — Encounter: Payer: Self-pay | Admitting: Family Medicine

## 2022-04-09 ENCOUNTER — Ambulatory Visit
Admission: RE | Admit: 2022-04-09 | Discharge: 2022-04-09 | Disposition: A | Discharge status: Home / Self Care | Payer: Medicare Other | Source: Ambulatory Visit | Attending: Internal Medicine | Admitting: Internal Medicine

## 2022-04-09 ENCOUNTER — Ambulatory Visit (INDEPENDENT_AMBULATORY_CARE_PROVIDER_SITE_OTHER): Payer: Medicare Other | Admitting: Family Medicine

## 2022-04-09 DIAGNOSIS — D259 Leiomyoma of uterus, unspecified: Secondary | ICD-10-CM

## 2022-04-09 NOTE — Progress Notes (Signed)
Patient states she is here to establish care. Patient states they found enlarged uterus.  ?Last pap smear done 01/29/2018. Patient thinks she had abnormal in her 20/30s. Kathrene Alu RN  ?

## 2022-04-09 NOTE — Progress Notes (Signed)
? ?  Subjective:  ? ? Patient ID: Linda Gay is a 66 y.o. female presenting with No chief complaint on file. ? on 04/09/2022 ? ?HPI: ?Here for fibroids. Seen on CT scan, done for back pain, which was actually a muscle spasm, and is now improved. ?Started menopause in late 16s and ended in early 17s. Had hot flashes and now that has improved. Cycles were not heavy or painful. There was no h/o enlarged uterus prior to now. She has not had any pain or bleeding since menopause. ?Had abnormal pap smear with laser. ? ?Review of Systems  ?Constitutional:  Negative for chills and fever.  ?Respiratory:  Negative for shortness of breath.   ?Cardiovascular:  Negative for chest pain.  ?Gastrointestinal:  Negative for abdominal pain, nausea and vomiting.  ?Genitourinary:  Negative for dysuria.  ?Skin:  Negative for rash.  ?   ?Objective:  ?  ?BP (!) 141/56   Pulse (!) 57   Ht '5\' 3"'$  (1.6 m)   Wt 130 lb (59 kg)   BMI 23.03 kg/m?  ?Physical Exam ?Exam conducted with a chaperone present.  ?Constitutional:   ?   General: She is not in acute distress. ?   Appearance: She is well-developed.  ?HENT:  ?   Head: Normocephalic and atraumatic.  ?Eyes:  ?   General: No scleral icterus. ?Cardiovascular:  ?   Rate and Rhythm: Normal rate.  ?Pulmonary:  ?   Effort: Pulmonary effort is normal.  ?Abdominal:  ?   Palpations: Abdomen is soft.  ?Genitourinary: ?   General: Normal vulva.  ?   Vagina: Normal.  ?   Cervix: Normal.  ?   Uterus: Normal. Not enlarged.   ?   Adnexa: Right adnexa normal and left adnexa normal.  ?Musculoskeletal:  ?   Cervical back: Neck supple.  ?Skin: ?   General: Skin is warm and dry.  ?Neurological:  ?   Mental Status: She is alert and oriented to person, place, and time.  ? ? ? ?   ?Assessment & Plan:  ? ?Problem List Items Addressed This Visit   ? ?  ? Unprioritized  ? Fibroid uterus  ?  Likely has been there since prior to menopause and unlikely to cause her any problems. Will check in annually. ? ?  ?  ? ? ?Return  in about 1 year (around 04/10/2023). ? ?Donnamae Jude, MD ?04/09/2022 ?9:03 AM ? ? ? ?

## 2022-04-09 NOTE — Assessment & Plan Note (Signed)
Likely has been there since prior to menopause and unlikely to cause her any problems. Will check in annually. ?

## 2022-04-23 ENCOUNTER — Other Ambulatory Visit: Payer: Self-pay | Admitting: Internal Medicine

## 2022-05-29 ENCOUNTER — Other Ambulatory Visit: Payer: Self-pay

## 2022-05-29 DIAGNOSIS — I351 Nonrheumatic aortic (valve) insufficiency: Secondary | ICD-10-CM

## 2022-06-11 ENCOUNTER — Ambulatory Visit (INDEPENDENT_AMBULATORY_CARE_PROVIDER_SITE_OTHER): Payer: Medicare Other | Admitting: Internal Medicine

## 2022-06-11 ENCOUNTER — Ambulatory Visit (HOSPITAL_COMMUNITY)
Admission: RE | Admit: 2022-06-11 | Discharge: 2022-06-11 | Disposition: A | Discharge status: Home / Self Care | Payer: Medicare Other | Source: Ambulatory Visit | Attending: Internal Medicine | Admitting: Internal Medicine

## 2022-06-11 ENCOUNTER — Encounter: Payer: Self-pay | Admitting: Internal Medicine

## 2022-06-11 VITALS — BP 122/66 | HR 58 | Temp 98.3°F | Resp 16 | Ht 63.0 in | Wt 132.0 lb

## 2022-06-11 DIAGNOSIS — G629 Polyneuropathy, unspecified: Secondary | ICD-10-CM | POA: Diagnosis not present

## 2022-06-11 DIAGNOSIS — G4700 Insomnia, unspecified: Secondary | ICD-10-CM | POA: Diagnosis not present

## 2022-06-11 DIAGNOSIS — E559 Vitamin D deficiency, unspecified: Secondary | ICD-10-CM | POA: Diagnosis not present

## 2022-06-11 DIAGNOSIS — I351 Nonrheumatic aortic (valve) insufficiency: Secondary | ICD-10-CM

## 2022-06-11 DIAGNOSIS — E039 Hypothyroidism, unspecified: Secondary | ICD-10-CM | POA: Diagnosis not present

## 2022-06-11 DIAGNOSIS — I10 Essential (primary) hypertension: Secondary | ICD-10-CM | POA: Diagnosis not present

## 2022-06-11 DIAGNOSIS — M25473 Effusion, unspecified ankle: Secondary | ICD-10-CM

## 2022-06-11 DIAGNOSIS — Z79899 Other long term (current) drug therapy: Secondary | ICD-10-CM

## 2022-06-11 LAB — LIPID PANEL
Cholesterol: 226 mg/dL — ABNORMAL HIGH (ref 0–200)
HDL: 104.5 mg/dL (ref >39.00)
LDL Cholesterol: 108 mg/dL — ABNORMAL HIGH (ref 0–99)
NonHDL: 121.98
Total CHOL/HDL Ratio: 2
Triglycerides: 68 mg/dL (ref 0.0–149.0)
VLDL: 13.6 mg/dL (ref 0.0–40.0)

## 2022-06-11 LAB — BASIC METABOLIC PANEL
BUN: 17 mg/dL (ref 6–23)
CO2: 30 mEq/L (ref 19–32)
Calcium: 9.8 mg/dL (ref 8.4–10.5)
Chloride: 103 mEq/L (ref 96–112)
Creatinine, Ser: 0.7 mg/dL (ref 0.40–1.20)
GFR: 89.06 mL/min (ref >60.00)
Glucose, Bld: 77 mg/dL (ref 70–99)
Potassium: 4.3 mEq/L (ref 3.5–5.1)
Sodium: 140 mEq/L (ref 135–145)

## 2022-06-11 LAB — ECHOCARDIOGRAM COMPLETE
Area-P 1/2: 2.95 cm2
Height: 63 in
P 1/2 time: 599 msec
S' Lateral: 2.5 cm
Weight: 2112 oz

## 2022-06-11 LAB — B12 AND FOLATE PANEL
Folate: 16.7 ng/mL (ref >5.9)
Vitamin B-12: 276 pg/mL (ref 211–911)

## 2022-06-11 LAB — VITAMIN D 25 HYDROXY (VIT D DEFICIENCY, FRACTURES): VITD: 35.64 ng/mL (ref 30.00–100.00)

## 2022-06-11 LAB — TSH: TSH: 1.2 u[IU]/mL (ref 0.35–5.50)

## 2022-06-11 MED ORDER — SHINGRIX 50 MCG/0.5ML IM SUSR: 0.5000 mL | Freq: Once | INTRAMUSCULAR | 1 refills | Status: AC

## 2022-06-11 NOTE — Patient Instructions (Addendum)
Recommended vaccines: - Shingrix, see prescription - COVID-vaccine booster - Flu shot every fall  For ankle swelling: Watch her salt intake Elevate your legs twice daily when you are at rest.  GO TO THE LAB : Get the blood work     [place], [place] back for a checkup in 6 to 7 months   "Living will", "[place] of attorney": Advanced care planning  (If you already have a living will or healthcare power of attorney, please bring the copy to be scanned in your chart.)  Advance care planning is a process that supports adults in  understanding and sharing their preferences regarding future medical care.   The patient's preferences are recorded in documents called Advance Directives.    Advanced directives are completed (and can be modified at any time) while the patient is in full mental capacity.   The documentation should be available at all times to the patient, the family and the healthcare providers.  Bring in a copy to be scanned in your chart is an excellent idea and is recommended   This legal documents direct treatment decision making and/or appoint a surrogate to make the decision if the patient is not capable to do so.    Advance directives can be documented in many types of formats,  documents have names such as:  Lliving will  Durable power of attorney for healthcare (healthcare proxy or healthcare power of attorney)  Combined directives  Physician orders for life-sustaining treatment    More information at:  [email or web address]

## 2022-06-11 NOTE — Progress Notes (Signed)
Subjective:    Patient ID: Linda Gay, female    DOB: September 07, 1956, 66 y.o.   MRN: [id number]  DOS:  06/11/2022 Type of visit - description: Follow-up  Since the last office visit is doing well. Reports occasional ankle swelling, no rhyme or reason for it , no worse at night. Admits she is not [place] very healthy [place] Denies chest pain or difficulty breathing. No palpitations.  Occasionally toes get numb, denies any unusual backache or paresthesias in any other part of the legs.   Review of Systems See above   Past Medical History:  Diagnosis Date   Hypertension    Hypothyroidism    Osteoporosis    Thyroid disease     Past Surgical History:  Procedure Laterality Date   BREAST BIOPSY Left 01/30/2016   CATARACT EXTRACTION Bilateral    COLONOSCOPY     12 [place]rs ago [facility]   HEMORRHOID SURGERY     TONSILLECTOMY      Current Outpatient Medications  Medication Instructions   ALPRAZolam (XANAX) 0.5 MG tablet TAKE 1 TABLET(0.5 MG) BY MOUTH AT BEDTIME AS NEEDED FOR ANXIETY   amLODipine (NORVASC) 5 MG tablet TAKE 1 TABLET(5 MG) BY MOUTH DAILY   levothyroxine (SYNTHROID) 112 mcg, Oral, Daily before breakfast   naproxen (NAPROSYN) 500 mg, Oral, 2 times daily   spironolactone (ALDACTONE) 25 mg, Oral, Daily   Zoster Vaccine Adjuvanted (SHINGRIX) injection 0.5 mLs, Intramuscular,  Once       Objective:   Physical Exam BP 122/66   Pulse (!) 58   Temp 98.3 F (36.8 C) (Oral)   Resp 16   Ht '5\' 3"'$  (1.6 m)   Wt 132 lb (59.9 kg)   SpO2 99%   BMI 23.38 kg/m  General:   Well developed, NAD, BMI noted.  HEENT:  Normocephalic . Face symmetric, atraumatic Lungs:  CTA B Normal respiratory effort, no intercostal retractions, no accessory muscle use. Heart: RRR,  no murmur.  Abdomen:  Not distended, soft, non-tender. No rebound or rigidity.   Skin: Not pale. Not jaundice Lower extremities: no pretibial edema bilaterally  Neurologic:  alert &  oriented X3.  Speech normal, gait appropriate for age and unassisted Motor and DTR symmetric Psych--  Cognition and judgment appear intact.  Cooperative with normal attention span and concentration.  Behavior appropriate. No anxious or depressed appearing.     Assessment     ASSESSMENT(new patient 01-2020, transfer) HTN DX 11/2020. Hypothyroidism Osteoporosis:   -07-2019 T score -2.6. s/p fosamax x ~ 18 months, self d/c d/t aches  -November 2022: T score -2.4, pt elected recheck dexa in 2 years Insomnia Aortic regurgitation  PLAN HTN:No ambulatory BPs, BP today is very good, she reports some lower extremity edema, exam today is negative.  We talk about changing medicines versus continue amlodipine and Aldactone along with low-salt diet and leg elevation.  We agreed on that, if edema is more frequent or intense will change to losartan HCT. Check a BMP, FLP Ankle edema: See above Hypothyroidism: On Synthroid, check labs, further advised with results Aortic regurgitation: To have echocardiogram today. Insomnia: On Xanax, UDS and contract today Toe paresthesias: Check a V76 and folic acid.  Motor and DTR's normal. Vitamin D deficiency, check labs Abnormal CT abdomen and pelvis: See LOV, uterine fibroid noted, reports she saw gynecology few months ago Social: Currently living at [facility].  Retired.   Preventive care reviewed RTC 6 to 7 months

## 2022-06-11 NOTE — Assessment & Plan Note (Signed)
HTN:No ambulatory BPs, BP today is very good, she reports some lower extremity edema, exam today is negative.  We talk about changing medicines versus continue amlodipine and Aldactone along with low-salt diet and leg elevation.  We agreed on that, if edema is more frequent or intense will change to losartan HCT. Check a BMP, FLP Ankle edema: See above Hypothyroidism: On Synthroid, check labs, further advised with results Aortic regurgitation: To have echocardiogram today. Insomnia: On Xanax, UDS and contract today Toe paresthesias: Check a C37 and folic acid.  Motor and DTR's normal. Vitamin D deficiency, check labs Abnormal CT abdomen and pelvis: See LOV, uterine fibroid noted, reports she saw gynecology few months ago Social: Currently living at [facility].  Retired.   Preventive care reviewed RTC 6 to 7 months

## 2022-06-11 NOTE — Assessment & Plan Note (Signed)
Tdap 2018 PNM 23: 2021; PNM 13:2020 Zostavax : 2017 Shingrix: Rx printed Covid vax: s/p bivalent 09-2021, ok to get a  booster at her convenience flu shot q year  Saw gyn few months ago per pt  MMG 04-09-22 ([facility]) Colonoscopy 07/2019, next per GI ACP info provided

## 2022-06-13 LAB — DRUG MONITORING PANEL 375977 , URINE
Alcohol Metabolites: POSITIVE ng/mL — AB (ref <500)
Alphahydroxyalprazolam: 43 ng/mL — ABNORMAL HIGH (ref <25)
Alphahydroxymidazolam: NEGATIVE ng/mL (ref <50)
Alphahydroxytriazolam: NEGATIVE ng/mL (ref <50)
Aminoclonazepam: NEGATIVE ng/mL (ref <25)
Amphetamines: NEGATIVE ng/mL (ref <500)
Barbiturates: NEGATIVE ng/mL (ref <300)
Benzodiazepines: POSITIVE ng/mL — AB (ref <100)
Cocaine Metabolite: NEGATIVE ng/mL (ref <150)
Desmethyltramadol: NEGATIVE ng/mL (ref <100)
Ethyl Glucuronide (ETG): >10000 ng/mL — ABNORMAL HIGH (ref <500)
Ethyl Sulfate (ETS): >10000 ng/mL — ABNORMAL HIGH (ref <100)
Hydroxyethylflurazepam: NEGATIVE ng/mL (ref <50)
Lorazepam: NEGATIVE ng/mL (ref <50)
Marijuana Metabolite: NEGATIVE ng/mL (ref <20)
Nordiazepam: NEGATIVE ng/mL (ref <50)
Opiates: NEGATIVE ng/mL (ref <100)
Oxazepam: NEGATIVE ng/mL (ref <50)
Oxycodone: NEGATIVE ng/mL (ref <100)
Temazepam: NEGATIVE ng/mL (ref <50)
Tramadol: NEGATIVE ng/mL (ref <100)

## 2022-06-13 LAB — DM TEMPLATE

## 2022-07-23 ENCOUNTER — Other Ambulatory Visit: Payer: Self-pay

## 2022-07-23 MED ORDER — SPIRONOLACTONE 25 MG PO TABS: 25.0000 mg | ORAL_TABLET | Freq: Every day | ORAL | 0 refills | Status: DC

## 2022-07-23 NOTE — Telephone Encounter (Signed)
Spironolactone 25 mg # 90 tablets only with message for future refills need to be requested from patients pcp.

## 2022-10-23 ENCOUNTER — Other Ambulatory Visit: Payer: Self-pay | Admitting: Cardiology

## 2022-10-23 ENCOUNTER — Telehealth: Payer: Self-pay | Admitting: Internal Medicine

## 2022-10-23 NOTE — Telephone Encounter (Signed)
PDMP okay, Rx sent 

## 2022-10-23 NOTE — Telephone Encounter (Signed)
Refill sent for 90 tablets with message for patient to contact her pcp for future refills due to patient has relocated.

## 2022-10-23 NOTE — Telephone Encounter (Signed)
Requesting: alprazolam 0.'5mg'$  Contract: 06/11/22 UDS: 06/11/22 Last Visit: 06/11/22 Next Visit: 12/12/22 Last Refill: 12/28/21 #30 and 2RF  Please Advise

## 2022-11-05 ENCOUNTER — Ambulatory Visit: Payer: Medicare Other

## 2022-11-14 ENCOUNTER — Ambulatory Visit (INDEPENDENT_AMBULATORY_CARE_PROVIDER_SITE_OTHER): Payer: Medicare Other | Admitting: *Deleted

## 2022-11-14 DIAGNOSIS — Z0000 Encounter for general adult medical examination without abnormal findings: Secondary | ICD-10-CM

## 2022-11-14 NOTE — Progress Notes (Addendum)
Subjective:   Linda Gay is a 66 y.o. female who presents for Medicare Annual (Subsequent) preventive examination.  I connected with  Linda Gay on 11/14/22 by a audio enabled telemedicine application and verified that I am speaking with the correct person using two identifiers.  Patient Location: Home  Provider Location: Office/Clinic  I discussed the limitations of evaluation and management by telemedicine. The patient expressed understanding and agreed to proceed.   Review of Systems    Defer to PCP Cardiac Risk Factors include: advanced age (>74mn, >>60women);hypertension     Objective:    There were no vitals filed for this visit. There is no height or weight on file to calculate BMI.     11/14/2022    9:42 AM 12/22/2021    8:39 PM 11/02/2021    3:44 PM  Advanced Directives  Does Patient Have a Medical Advance Directive? No Yes No  Type of Advance Directive  Living will;Healthcare Power of Attorney   Would patient like information on creating a medical advance directive? No - Patient declined No - Patient declined Yes (MAU/Ambulatory/Procedural Areas - Information given)    Current Medications (verified) Outpatient Encounter Medications as of 11/14/2022  Medication Sig   ALPRAZolam (XANAX) 0.5 MG tablet TAKE 1 TABLET BY MOUTH EVERY NIGHT AT BEDTIME AS NEEDED FOR ANXIETY   amLODipine (NORVASC) 5 MG tablet Take 1 tablet (5 mg total) by mouth daily.   levothyroxine (SYNTHROID) 112 MCG tablet Take 1 tablet (112 mcg total) by mouth daily before breakfast.   spironolactone (ALDACTONE) 25 MG tablet Take 1 tablet (25 mg total) by mouth daily. Will need to contact primary care for future refills   [DISCONTINUED] naproxen (NAPROSYN) 500 MG tablet Take 1 tablet (500 mg total) by mouth 2 (two) times daily. (Patient not taking: Reported on 06/11/2022)   No facility-administered encounter medications on file as of 11/14/2022.    Allergies (verified) Patient has no known  allergies.   History: Past Medical History:  Diagnosis Date   Hypertension    Hypothyroidism    Osteoporosis    Thyroid disease    Past Surgical History:  Procedure Laterality Date   BREAST BIOPSY Left 01/30/2016   CATARACT EXTRACTION Bilateral    COLONOSCOPY     [place]rs ago [place] [facility]   HEMORRHOID SURGERY     TONSILLECTOMY     Family History  Problem Relation Age of Onset   Hypertension Mother    Cancer Father        prostate   Prostate cancer Father    Hypertension Brother    Colon cancer Paternal Aunt    Prostate cancer Paternal Uncle    Stroke Maternal Grandmother    Heart disease Maternal Grandmother    Cancer Maternal Grandfather        lung   Diabetes Paternal Grandmother    Cancer Paternal Grandfather        prostate   Prostate cancer Paternal Grandfather    Colon polyps Neg Hx    Esophageal cancer Neg Hx    Rectal cancer Neg Hx    Stomach cancer Neg Hx    Social History   Socioeconomic History   Marital status: Widowed    Spouse name: Not on file   Number of children: Not on file   Years of education: Not on file   Highest education level: Not on file  Occupational History   Occupation: part time [employer]  Tobacco Use  Smoking status: Never   Smokeless tobacco: Never  Vaping Use   Vaping Use: Never used  Substance and Sexual Activity   Alcohol use: Yes    Comment: wine daily 1-2 glasses a dat    Drug use: No   Sexual activity: Yes    Partners: Male  Other Topics Concern   Not on file  Social History Narrative   Widowed, moved to the [place] 01/2022    Retired       Has step children   Social Determinants of Health   Financial Resource Strain: [place]  (11/02/2021)   Overall Financial Resource Strain (CARDIA)    Difficulty of Paying Living Expenses: Not hard at all  Food Insecurity: No Food Insecurity (11/14/2022)   Hunger Vital Sign    Worried About Running Out of Food in the Last Year: Never true    [place] in the  Last Year: Never true  Transportation Needs: No Transportation Needs (11/14/2022)   PRAPARE - Hydrologist (Medical): No    Lack of Transportation (Non-Medical): No  Physical Activity: Inactive (11/02/2021)   Exercise Vital Sign    Days of Exercise per Week: 0 days    Minutes of Exercise per Session: 0 min  Stress: No Stress Concern Present (11/02/2021)   [place]    Feeling of Stress : Not at all  Social Connections: Socially Isolated (11/02/2021)   Social Connection and Isolation Panel [NHANES]    Frequency of Communication with Friends and Family: More than three times a week    Frequency of Social Gatherings with Friends and Family: More than three times a week    Attends Religious Services: Never    Marine scientist or Organizations: No    Attends Archivist Meetings: Never    Marital Status: Widowed    Tobacco Counseling Counseling given: Not Answered   Clinical Intake:  Pre-visit preparation completed: Yes  Pain : No/denies pain  Diabetes: No  How often do you need to have someone help you when you read instructions, pamphlets, or other written materials from your doctor or pharmacy?: 1 - Never   Activities of Daily Living    11/14/2022    9:44 AM  In your present state of health, do you have any difficulty performing the following activities:  Hearing? 0  Vision? 1  Difficulty concentrating or making decisions? 0  Walking or climbing stairs? 0  Dressing or bathing? 0  Doing errands, shopping? 0  Preparing Food and eating ? N  Using the Toilet? N  In the past six months, have you accidently leaked urine? N  Do you have problems with loss of bowel control? N  Managing your Medications? N  Managing your Finances? N  Housekeeping or managing your Housekeeping? N    Patient Care Team: Colon Branch, MD as PCP - General (Internal Medicine) Bettina Gavia  Hilton Cork, MD as PCP - Cardiology (Cardiology) Armbruster, Carlota Raspberry, MD as Consulting Physician (Gastroenterology) [place], Heather, [place] (Optometry)  Indicate any recent Medical Services you may have received from other than [facility] providers in the past year (date may be approximate).     Assessment:   This is a routine wellness examination for Linda Gay.  Hearing/Vision screen No results found.  Dietary issues and exercise activities discussed: Current Exercise Habits: The patient does not participate in regular exercise at present, Exercise limited by: None identified  Goals Addressed   None    Depression Screen    11/14/2022    9:44 AM 06/11/2022    8:25 AM 11/02/2021    3:50 PM 09/11/2021    2:16 PM 12/14/2020   11:04 AM 05/13/2020    1:40 PM 01/06/2020    1:41 PM  PHQ 2/9 Scores  PHQ - 2 Score 0 0 0 0 0 0 2  PHQ- 9 Score      3 8    Fall Risk    11/14/2022    9:42 AM 06/11/2022    8:24 AM 11/02/2021    3:49 PM 09/11/2021    2:16 PM 05/12/2021    8:15 AM  Fall Risk   Falls in the past year? 0 0 0 0 1  Number falls in past yr: 0 0 0 0 0  Injury with Fall? 0 0 0 0 0  Risk for fall due to : No Fall Risks      Follow up Falls evaluation completed Falls evaluation completed Falls prevention discussed Falls evaluation completed Falls evaluation completed    Edith Endave:  Any stairs in or around the home? No  If so, are there any without handrails? No  Home free of loose throw rugs in walkways, pet beds, electrical cords, etc? Yes  Adequate lighting in your home to reduce risk of falls? Yes   ASSISTIVE DEVICES UTILIZED TO PREVENT FALLS:  Life alert? No  Use of a cane, walker or w/c? No  Grab bars in the bathroom? No  Shower chair or bench in shower? Yes  Elevated toilet seat or a handicapped toilet?  Comfort height  TIMED UP AND GO:  Was the test performed?  No, audio visit .   Cognitive Function:        11/14/2022    9:50 AM  6CIT  Screen  What Year? 0 points  What month? 0 points  What time? 0 points  Count back from 20 0 points  Months in reverse 0 points  Repeat phrase 0 points  Total Score 0 points    Immunizations Immunization History  Administered Date(s) Administered   Fluad Quad(high Dose 65+) 09/11/2021   Influenza Whole 09/14/2010, 09/07/2011   Influenza, High Dose Seasonal PF 09/16/2020   Influenza, Quadrivalent, Recombinant, Inj, Pf 09/16/2017   Influenza,inj,Quad PF,6+ Mos 09/04/2013, 09/18/2014, 12/27/2015, 11/12/2018   Influenza-Unspecified 09/29/2016, 09/03/2019, 09/18/2022   PFIZER(Purple Top)SARS-COV-2 Vaccination 02/05/2020, 03/02/2020, 10/10/2020   Pfizer Covid-19 Vaccine Bivalent Booster 29yr & up 09/11/2021   Pneumococcal Conjugate-13 10/12/2019   Pneumococcal Polysaccharide-23 11/11/2020   Td 10/05/2010   Tdap 09/15/2017   Zoster, Live 05/10/2016    TDAP status: Up to date  Flu Vaccine status: Up to date  Pneumococcal vaccine status: Up to date  Covid-19 vaccine status: Information provided on how to obtain vaccines.   Qualifies for Shingles Vaccine? Yes   Zostavax completed Yes   Shingrix Completed?: No.    Education has been provided regarding the importance of this vaccine. Patient has been advised to call insurance company to determine out of pocket expense if they have not yet received this vaccine. Advised may also receive vaccine at local pharmacy or Health Dept. Verbalized acceptance and understanding.  Screening Tests Health Maintenance  Topic Date Due   Zoster Vaccines- Shingrix (1 of 2) Never done   COVID-19 Vaccine (5 - 2023-24 season) 08/03/2022   Medicare Annual Wellness (AWV)  11/02/2022   DEXA SCAN  10/24/2023  MAMMOGRAM  04/09/2024   DTaP/Tdap/Td (3 - Td or Tdap) 09/16/2027   COLONOSCOPY (Pts 45-74yr Insurance coverage will need to be confirmed)  07/30/2029   Pneumonia Vaccine 68 Years old  Completed   INFLUENZA VACCINE  Completed   Hepatitis C  Screening  Completed   HPV VACCINES  Aged Out    Health Maintenance  Health Maintenance Due  Topic Date Due   Zoster Vaccines- Shingrix (1 of 2) Never done   COVID-19 Vaccine (5 - 2023-24 season) 08/03/2022   Medicare Annual Wellness (AWV)  11/02/2022    Colorectal cancer screening: Type of screening: Colonoscopy. Completed 07/31/19. Repeat every 10 years  Mammogram status: Completed 04/09/22. Repeat every year  Bone Density status: Completed 10/23/21. Results reflect: Bone density results: OSTEOPENIA. Repeat every 2 years.  Lung Cancer Screening: (Low Dose CT Chest recommended if Age 68-80years, 30 pack-year currently smoking OR have quit w/in 15years.) does not qualify.   Additional Screening:  Hepatitis C Screening: does qualify; Completed 12/27/15  Vision Screening: Recommended annual ophthalmology exams for early detection of glaucoma and other disorders of the eye. Is the patient up to date with their annual eye exam?  No  Who is the provider or what is the name of the office in which the patient attends annual eye exams? [place] If pt is not established with a provider, would they like to be referred to a provider to establish care? No .   Dental Screening: Recommended annual dental exams for proper oral hygiene  Community Resource Referral / Chronic Care Management: CRR required this visit?  No   CCM required this visit?  No      Plan:     I have personally reviewed and noted the following in the patient's chart:   Medical and social history Use of alcohol, tobacco or illicit drugs  Current medications and supplements including opioid prescriptions. Patient is not currently taking opioid prescriptions. Functional ability and status Nutritional status Physical activity Advanced directives List of other physicians Hospitalizations, surgeries, and ER visits in previous 12 months Vitals Screenings to include  cognitive, depression, and falls Referrals and appointments  In addition, I have reviewed and discussed with patient certain preventive protocols, quality metrics, and best practice recommendations. A written personalized care plan for preventive services as well as general preventive health recommendations were provided to patient.   Due to this being a telephonic visit, the after visit summary with patients personalized plan was offered to patient via mail or my-chart.  Patient would like to access on my-chart.  BBeatris Ship CSanders  11/14/2022   Nurse Notes: None  I have reviewed and agree with [facility] documentation.  JKathlene November MD

## 2022-11-14 NOTE — Patient Instructions (Signed)
Linda Gay , Thank you for taking time to come for your Medicare Wellness Visit. I appreciate your ongoing commitment to your health goals. Please review the following plan we discussed and let me know if I can assist you in the future.   These are the goals we discussed:  Goals      Patient Stated     Increase activity & drink more water        This is a list of the screening recommended for you and due dates:  Health Maintenance  Topic Date Due   Zoster (Shingles) Vaccine (1 of 2) Never done   COVID-19 Vaccine (5 - 2023-24 season) 08/03/2022   DEXA scan (bone density measurement)  10/24/2023   Medicare Annual Wellness Visit  11/15/2023   Mammogram  04/09/2024   DTaP/Tdap/Td vaccine (3 - Td or Tdap) 09/16/2027   Colon Cancer Screening  07/30/2029   Pneumonia Vaccine  Completed   Flu Shot  Completed   Hepatitis C Screening: USPSTF Recommendation to screen - Ages 18-79 yo.  Completed   HPV Vaccine  Aged Out     Next appointment: Follow up in one year for your annual wellness visit.   Preventive Care 31 Years and Older, Female Preventive care refers to lifestyle choices and visits with your health care provider that can promote health and wellness. What does preventive care include? A yearly physical exam. This is also called an annual well check. Dental exams once or twice a year. Routine eye exams. Ask your health care provider how often you should have your eyes checked. Personal lifestyle choices, including: Daily care of your teeth and gums. Regular physical activity. Eating a healthy diet. Avoiding tobacco and drug use. Limiting alcohol use. Practicing safe sex. Taking low-dose aspirin every day. Taking vitamin and mineral supplements as recommended by your health care provider. What happens during an annual well check? The services and screenings done by your health care provider during your annual well check will depend on your age, overall health, lifestyle  risk factors, and family history of disease. Counseling  Your health care provider may ask you questions about your: Alcohol use. Tobacco use. Drug use. Emotional well-being. Home and relationship well-being. Sexual activity. Eating habits. History of falls. Memory and ability to understand (cognition). Work and work Statistician. Reproductive health. Screening  You may have the following tests or measurements: Height, weight, and BMI. Blood pressure. Lipid and cholesterol levels. These may be checked every 5 years, or more frequently if you are over 47 years old. Skin check. Lung cancer screening. You may have this screening every year starting at age 48 if you have a 30-pack-year history of smoking and currently smoke or have quit within the past 15 years. Fecal occult blood test (FOBT) of the stool. You may have this test every year starting at age 33. Flexible sigmoidoscopy or colonoscopy. You may have a sigmoidoscopy every 5 years or a colonoscopy every 10 years starting at age 72. Hepatitis C blood test. Hepatitis B blood test. Sexually transmitted disease (STD) testing. Diabetes screening. This is done by checking your blood sugar (glucose) after you have not eaten for a while (fasting). You may have this done every 1-3 years. Bone density scan. This is done to screen for osteoporosis. You may have this done starting at age 37. Mammogram. This may be done every 1-2 years. Talk to your health care provider about how often you should have regular mammograms. Talk with your health care  provider about your test results, treatment options, and if necessary, the need for more tests. Vaccines  Your health care provider may recommend certain vaccines, such as: Influenza vaccine. This is recommended every year. Tetanus, diphtheria, and acellular pertussis (Tdap, Td) vaccine. You may need a Td booster every 10 years. Zoster vaccine. You may need this after age 79. Pneumococcal  13-valent conjugate (PCV13) vaccine. One dose is recommended after age 35. Pneumococcal polysaccharide (PPSV23) vaccine. One dose is recommended after age 67. Talk to your health care provider about which screenings and vaccines you need and how often you need them. This information is not intended to replace advice given to you by your health care provider. Make sure you discuss any questions you have with your health care provider. Document Released: 12/16/2015 Document Revised: 08/08/2016 Document Reviewed: 09/20/2015 Elsevier Interactive Patient Education  2017 [place] Prevention in the Home Falls can cause injuries. They can happen to people of all ages. There are many things you can do to make your home safe and to help prevent falls. What can I do on the outside of my home? Regularly fix the edges of walkways and driveways and fix any cracks. Remove anything that might make you trip as you walk through a door, such as a raised step or threshold. Trim any bushes or trees on the path to your home. Use bright outdoor lighting. Clear any walking paths of anything that might make someone trip, such as rocks or tools. Regularly check to see if handrails are loose or broken. Make sure that both sides of any steps have handrails. Any raised decks and porches should have guardrails on the edges. Have any leaves, snow, or ice cleared regularly. Use sand or salt on walking paths during winter. Clean up any spills in your garage right away. This includes oil or grease spills. What can I do in the bathroom? Use night lights. Install grab bars by the toilet and in the tub and shower. Do not use towel bars as grab bars. Use non-skid mats or decals in the tub or shower. If you need to sit down in the shower, use a plastic, non-slip stool. Keep the floor dry. Clean up any water that spills on the floor as soon as it happens. Remove soap buildup in the tub or shower regularly. Attach  bath mats securely with double-sided non-slip rug tape. Do not have throw rugs and other things on the floor that can make you trip. What can I do in the bedroom? Use night lights. Make sure that you have a light by your bed that is easy to reach. Do not use any sheets or blankets that are too big for your bed. They should not hang down onto the floor. Have a firm chair that has side arms. You can use this for support while you get dressed. Do not have throw rugs and other things on the floor that can make you trip. What can I do in the kitchen? Clean up any spills right away. Avoid walking on wet floors. Keep items that you use a lot in easy-to-reach places. If you need to reach something above you, use a strong step stool that has a grab bar. Keep electrical cords out of the way. Do not use floor polish or wax that makes floors slippery. If you must use wax, use non-skid floor wax. Do not have throw rugs and other things on the floor that can make you trip. What can I  do with my stairs? Do not leave any items on the stairs. Make sure that there are handrails on both sides of the stairs and use them. Fix handrails that are broken or loose. Make sure that handrails are as long as the stairways. Check any carpeting to make sure that it is firmly attached to the stairs. Fix any carpet that is loose or worn. Avoid having throw rugs at the top or bottom of the stairs. If you do have throw rugs, attach them to the floor with carpet tape. Make sure that you have a light switch at the top of the stairs and the bottom of the stairs. If you do not have them, ask someone to add them for you. What else can I do to help prevent falls? Wear shoes that: Do not have high heels. Have rubber bottoms. Are comfortable and fit you well. Are closed at the toe. Do not wear sandals. If you use a stepladder: Make sure that it is fully opened. Do not climb a closed stepladder. Make sure that both sides of the  stepladder are locked into place. Ask someone to hold it for you, if possible. Clearly mark and make sure that you can see: Any grab bars or handrails. First and last steps. Where the edge of each step is. Use tools that help you move around (mobility aids) if they are needed. These include: Canes. Walkers. Scooters. Crutches. Turn on the lights when you go into a dark area. Replace any light bulbs as soon as they burn out. Set up your furniture so you have a clear path. Avoid moving your furniture around. If any of your floors are uneven, fix them. If there are any pets around you, be aware of where they are. Review your medicines with your doctor. Some medicines can make you feel dizzy. This can increase your chance of falling. Ask your doctor what other things that you can do to help prevent falls. This information is not intended to replace advice given to you by your health care provider. Make sure you discuss any questions you have with your health care provider. Document Released: 09/15/2009 Document Revised: 04/26/2016 Document Reviewed: 12/24/2014 Elsevier Interactive Patient Education  2017 [employer].

## 2022-12-12 ENCOUNTER — Ambulatory Visit (INDEPENDENT_AMBULATORY_CARE_PROVIDER_SITE_OTHER): Payer: Medicare Other | Admitting: Internal Medicine

## 2022-12-12 ENCOUNTER — Encounter: Payer: Self-pay | Admitting: Internal Medicine

## 2022-12-12 VITALS — BP 130/68 | HR 58 | Temp 98.3°F | Resp 16 | Ht 63.0 in | Wt 139.1 lb

## 2022-12-12 DIAGNOSIS — I10 Essential (primary) hypertension: Secondary | ICD-10-CM | POA: Diagnosis not present

## 2022-12-12 DIAGNOSIS — E039 Hypothyroidism, unspecified: Secondary | ICD-10-CM

## 2022-12-12 NOTE — Assessment & Plan Note (Signed)
HTN: On amlodipine and Aldactone, no swelling noted.  Checking CBC and BMP.  BP is very good today. Hypothyroidism: On Synthroid, check TSH, further advised with results. 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  Sees dermatology Preventive care: Had a flu shot, recommend shingles COVID and RSV Social: Lives at [facility], her mother also lives there at a retirement community, has mild dementia. RTC 6 months

## 2022-12-12 NOTE — Patient Instructions (Addendum)
Vaccines I recommend:  Covid booster Shingrix (shingles) RSV vaccine  Check the  blood pressure regularly BP GOAL is between 110/65 and  135/85. If it is consistently higher or lower, let me know     GO TO THE LAB : Get the blood work     [place], [place] back for   checkup in 6 months

## 2022-12-12 NOTE — Progress Notes (Signed)
   Subjective:    Patient ID: Linda Gay, female    DOB: March 18, 1956, 67 y.o.   MRN: [id number]  DOS:  12/12/2022 Type of visit - description: Follow-up  Doing well. Denies any major problems. Good med compliance. Still occasionally has swelling around the ankles.  Review of Systems See above   Past Medical History:  Diagnosis Date   Hypertension    Hypothyroidism    Osteoporosis    Thyroid disease     Past Surgical History:  Procedure Laterality Date   BREAST BIOPSY Left 01/30/2016   CATARACT EXTRACTION Bilateral    COLONOSCOPY     12 yrs ago in [facility]   HEMORRHOID SURGERY     TONSILLECTOMY      Current Outpatient Medications  Medication Instructions   ALPRAZolam (XANAX) 0.5 MG tablet TAKE 1 TABLET BY MOUTH EVERY NIGHT AT BEDTIME AS NEEDED FOR ANXIETY   amLODipine (NORVASC) 5 mg, Oral, Daily   levothyroxine (SYNTHROID) 112 mcg, Oral, Daily before breakfast   spironolactone (ALDACTONE) 25 mg, Oral, Daily, Will need to contact primary care for future refills       Objective:   Physical Exam BP 130/68   Pulse (!) 58   Temp 98.3 F (36.8 C) (Oral)   Resp 16   Ht '5\' 3"'$  (1.6 m)   Wt 139 lb 2 oz (63.1 kg)   SpO2 97%   BMI 24.64 kg/m  General:   Well developed, NAD, BMI noted. HEENT:  Normocephalic . Face symmetric, atraumatic Lungs:  CTA B Normal respiratory effort, no intercostal retractions, no accessory muscle use. Heart: RRR,  no murmur.  Lower extremities: no pretibial edema bilaterally  Skin: Not pale. Not jaundice Neurologic:  alert & oriented X3.  Speech normal, gait appropriate for age and unassisted Psych--  Cognition and judgment appear intact.  Cooperative with normal attention span and concentration.  Behavior appropriate. No anxious or depressed appearing.      Assessment   ASSESSMENT(new patient 01-2020, transfer) HTN DX 11/2020. Hypothyroidism Osteoporosis:   -07-2019 T score -2.6. s/p fosamax x ~ 18 months, self d/c d/t aches   -November 2022: T score -2.4, pt elected recheck dexa in 2 years Insomnia Aortic regurgitation, Echo 06-11-2022 NEG SCC  PLAN HTN: On amlodipine and Aldactone, no swelling noted.  Checking CBC and BMP.  BP is very good today. Hypothyroidism: On Synthroid, check TSH, further advised with results. 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  Sees dermatology Preventive care: Had a flu shot, recommend shingles COVID and RSV Social: Lives at [facility], her mother also lives there at a retirement community, has mild dementia. RTC 6 months

## 2022-12-13 LAB — CBC WITH DIFFERENTIAL/PLATELET
Absolute Monocytes: 260 cells/uL (ref 200–950)
Basophils Absolute: 20 cells/uL (ref 0–200)
Basophils Relative: 0.5 %
Eosinophils Absolute: 120 cells/uL (ref 15–500)
Eosinophils Relative: 3 %
HCT: 37.7 % (ref 35.0–45.0)
Hemoglobin: 13.1 g/dL (ref 11.7–15.5)
Lymphs Abs: 908 cells/uL (ref 850–3900)
MCH: 31 pg (ref 27.0–33.0)
MCHC: 34.7 g/dL (ref 32.0–36.0)
MCV: 89.3 fL (ref 80.0–100.0)
MPV: 10.4 fL (ref 7.5–12.5)
Monocytes Relative: 6.5 %
Neutro Abs: 2692 cells/uL (ref 1500–7800)
Neutrophils Relative %: 67.3 %
Platelets: 188 10*3/uL (ref 140–400)
RBC: 4.22 10*6/uL (ref 3.80–5.10)
RDW: 11.9 % (ref 11.0–15.0)
Total Lymphocyte: 22.7 %
WBC: 4 10*3/uL (ref 3.8–10.8)

## 2022-12-13 LAB — BASIC METABOLIC PANEL
BUN: 14 mg/dL (ref 7–25)
CO2: 24 mmol/L (ref 20–32)
Calcium: 9.9 mg/dL (ref 8.6–10.4)
Chloride: 104 mmol/L (ref 98–110)
Creat: 0.71 mg/dL (ref 0.50–1.05)
Glucose, Bld: 103 mg/dL — ABNORMAL HIGH (ref 65–99)
Potassium: 4.1 mmol/L (ref 3.5–5.3)
Sodium: 140 mmol/L (ref 135–146)

## 2022-12-13 LAB — TSH: TSH: 1.65 mIU/L (ref 0.40–4.50)

## 2023-01-21 IMAGING — MG MM DIGITAL SCREENING BILAT W/ TOMO AND CAD
8 series · 9 of 24 positions shown · non-contrast
Comparison: Previous exam(s).

CLINICAL DATA: Screening.

EXAM:
DIGITAL SCREENING BILATERAL MAMMOGRAM WITH TOMOSYNTHESIS AND CAD
TECHNIQUE: Bilateral screening digital craniocaudal and mediolateral oblique
mammograms were obtained. Bilateral screening digital breast
tomosynthesis was performed. The images were evaluated with
computer-aided detection.

[L MLO synth-2D]
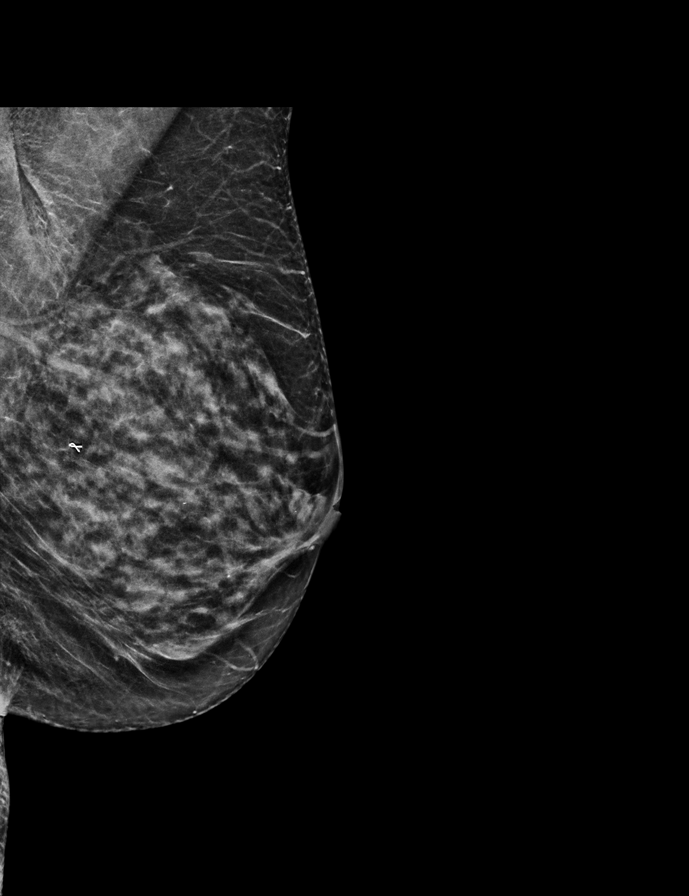

[R MLO synth-2D]
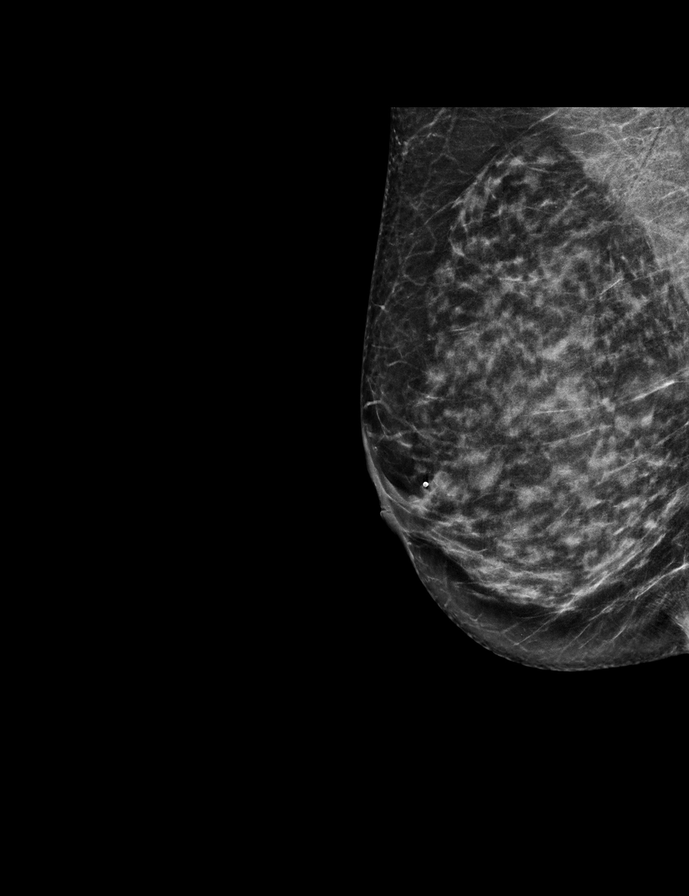

[R CC synth-2D]
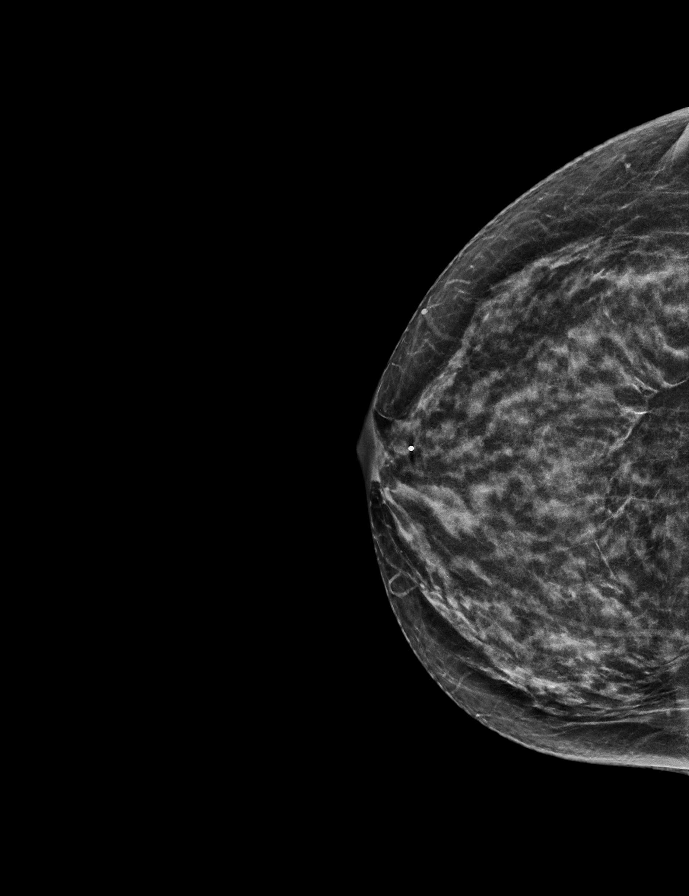

[L CC synth-2D]
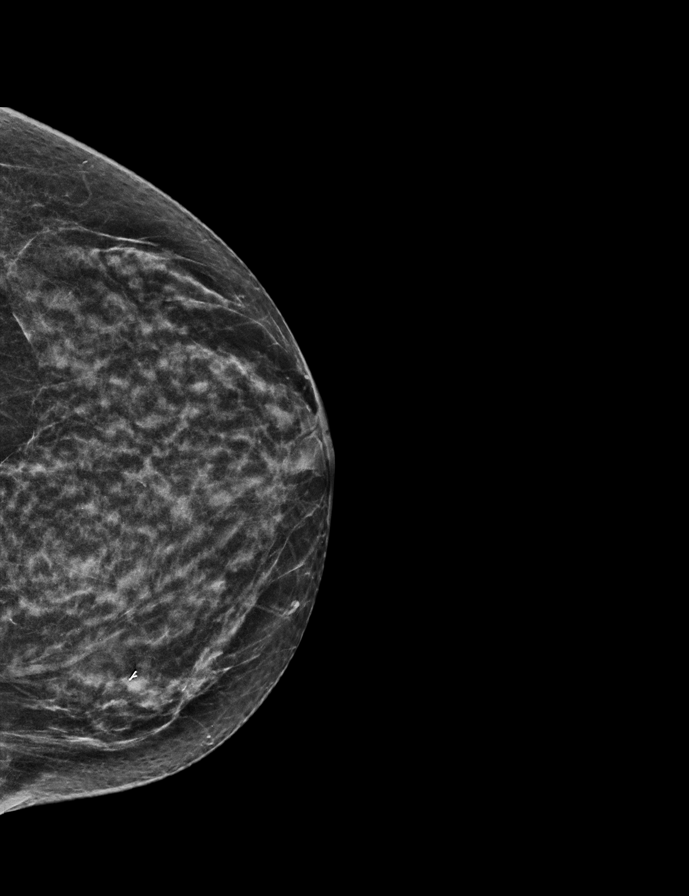

[R CC tomo · 2 of 45 frames shown]
[frame 15/45]
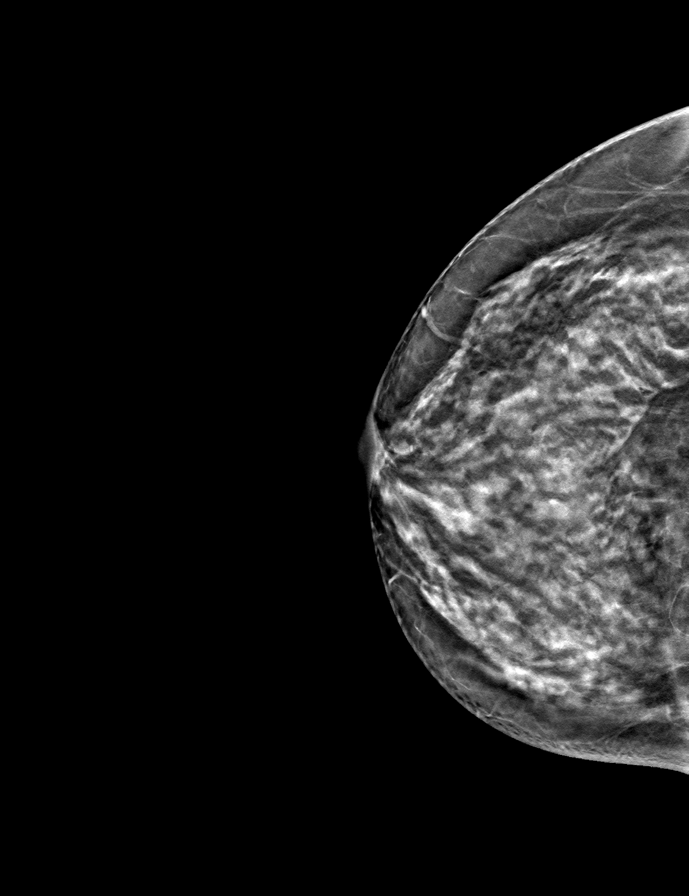
[frame 23/45]
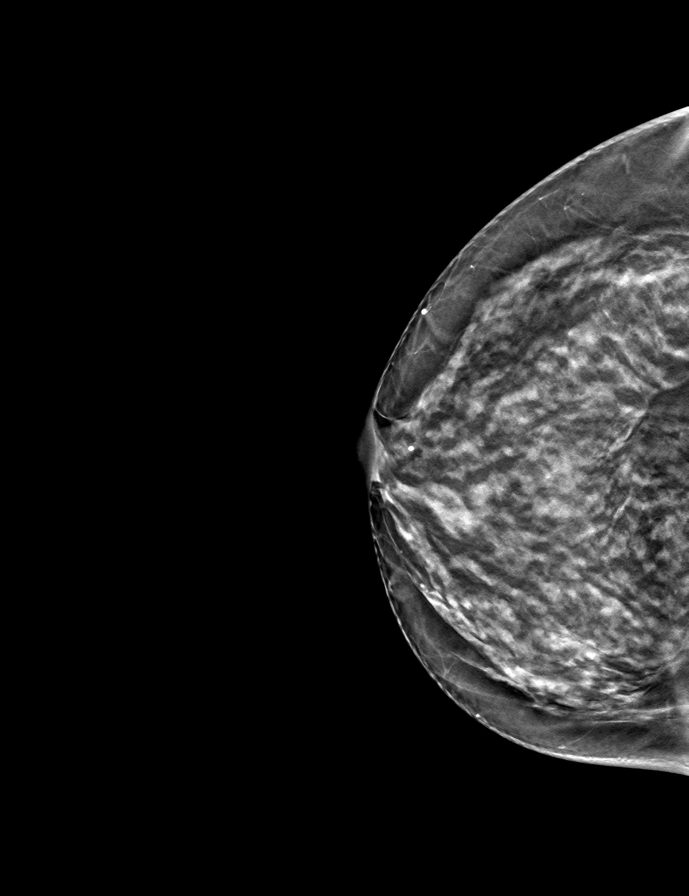

[L MLO tomo · tomo slice 25/49.0]
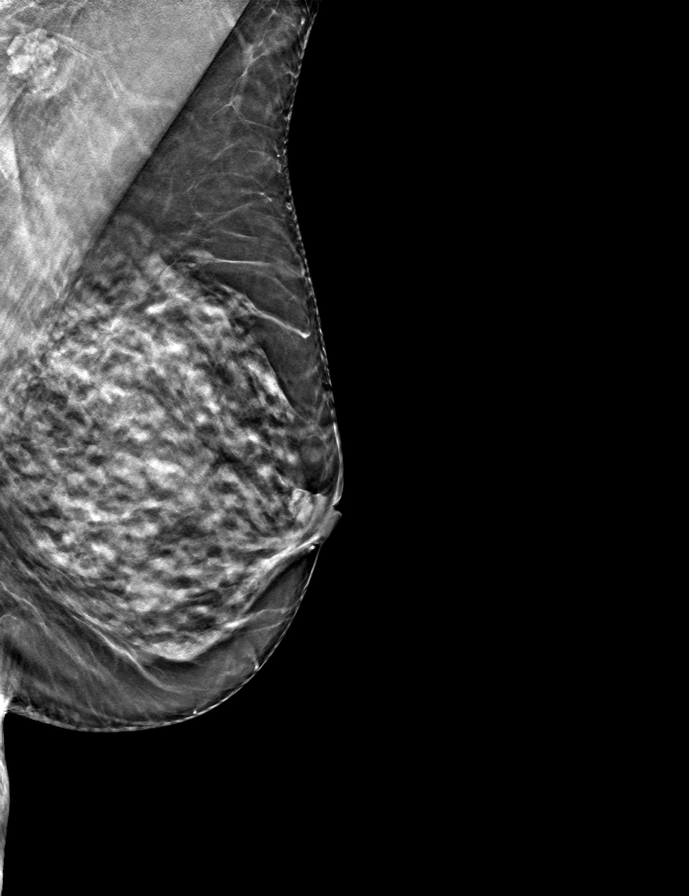

[L CC tomo · tomo slice 22/43.0]
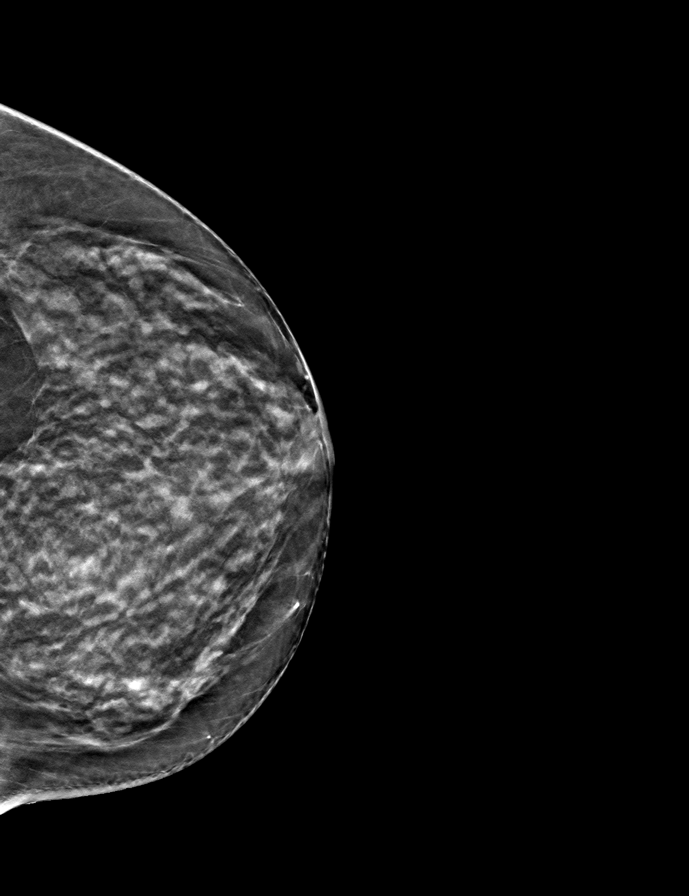

[R MLO tomo · tomo slice 27/53.0]
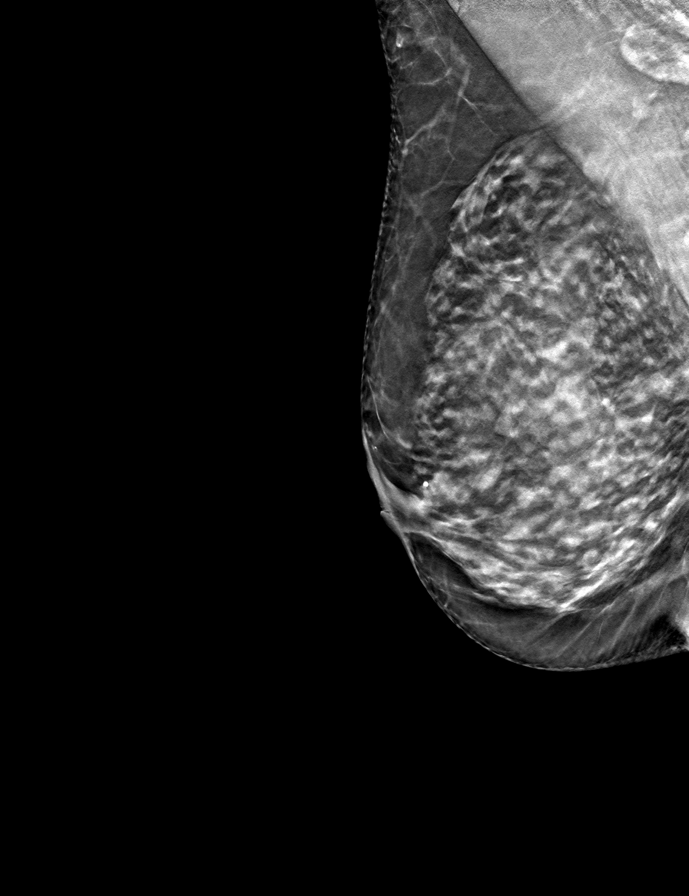

[9 of 24 positions shown; findings below may reference images not displayed]

ACR Breast Density Category c: The breast tissue is heterogeneously
dense, which may obscure small masses.
FINDINGS: There are no findings suspicious for malignancy.
IMPRESSION: No mammographic evidence of malignancy. A result letter of this
screening mammogram will be mailed directly to the patient.

RECOMMENDATION:
Screening mammogram in one year. (Code:[id number])

BI-RADS CATEGORY  1: Negative.

## 2023-01-23 ENCOUNTER — Other Ambulatory Visit: Payer: Self-pay | Admitting: Cardiology

## 2023-03-04 ENCOUNTER — Other Ambulatory Visit: Payer: Self-pay | Admitting: Cardiology

## 2023-03-05 ENCOUNTER — Encounter: Payer: Self-pay | Admitting: Cardiology

## 2023-03-05 ENCOUNTER — Encounter: Payer: Self-pay | Admitting: Internal Medicine

## 2023-03-05 ENCOUNTER — Other Ambulatory Visit: Payer: Self-pay

## 2023-04-17 ENCOUNTER — Other Ambulatory Visit: Payer: Self-pay | Admitting: Internal Medicine

## 2023-04-23 ENCOUNTER — Other Ambulatory Visit: Payer: Self-pay | Admitting: Internal Medicine

## 2023-04-23 DIAGNOSIS — Z0000 Encounter for general adult medical examination without abnormal findings: Secondary | ICD-10-CM

## 2023-06-10 ENCOUNTER — Ambulatory Visit: Payer: BLUE CROSS/BLUE SHIELD

## 2023-06-12 ENCOUNTER — Encounter: Payer: Self-pay | Admitting: Internal Medicine

## 2023-06-12 ENCOUNTER — Ambulatory Visit
Admission: RE | Admit: 2023-06-12 | Discharge: 2023-06-12 | Disposition: A | Discharge status: Home / Self Care | Payer: Medicare Other | Source: Ambulatory Visit | Attending: Internal Medicine | Admitting: Internal Medicine

## 2023-06-12 ENCOUNTER — Ambulatory Visit: Payer: Medicare Other | Admitting: Internal Medicine

## 2023-06-12 VITALS — BP 116/62 | HR 62 | Temp 98.9°F | Resp 20 | Wt 131.0 lb

## 2023-06-12 DIAGNOSIS — E039 Hypothyroidism, unspecified: Secondary | ICD-10-CM | POA: Diagnosis not present

## 2023-06-12 DIAGNOSIS — Z79899 Other long term (current) drug therapy: Secondary | ICD-10-CM | POA: Diagnosis not present

## 2023-06-12 DIAGNOSIS — E559 Vitamin D deficiency, unspecified: Secondary | ICD-10-CM | POA: Diagnosis not present

## 2023-06-12 DIAGNOSIS — I10 Essential (primary) hypertension: Secondary | ICD-10-CM | POA: Diagnosis not present

## 2023-06-12 DIAGNOSIS — Z0000 Encounter for general adult medical examination without abnormal findings: Secondary | ICD-10-CM

## 2023-06-12 DIAGNOSIS — M810 Age-related osteoporosis without current pathological fracture: Secondary | ICD-10-CM

## 2023-06-12 DIAGNOSIS — G4700 Insomnia, unspecified: Secondary | ICD-10-CM

## 2023-06-12 DIAGNOSIS — I351 Nonrheumatic aortic (valve) insufficiency: Secondary | ICD-10-CM

## 2023-06-12 LAB — BASIC METABOLIC PANEL
BUN: 12 mg/dL (ref 6–23)
CO2: 29 mEq/L (ref 19–32)
Calcium: 10.2 mg/dL (ref 8.4–10.5)
Chloride: 101 mEq/L (ref 96–112)
Creatinine, Ser: 0.68 mg/dL (ref 0.40–1.20)
GFR: 89.05 mL/min (ref >60.00)
Glucose, Bld: 105 mg/dL — ABNORMAL HIGH (ref 70–99)
Potassium: 4.1 mEq/L (ref 3.5–5.1)
Sodium: 140 mEq/L (ref 135–145)

## 2023-06-12 LAB — LIPID PANEL
Cholesterol: 203 mg/dL — ABNORMAL HIGH (ref 0–200)
HDL: 70.4 mg/dL (ref >39.00)
LDL Cholesterol: 117 mg/dL — ABNORMAL HIGH (ref 0–99)
NonHDL: 133.03
Total CHOL/HDL Ratio: 3
Triglycerides: 78 mg/dL (ref 0.0–149.0)
VLDL: 15.6 mg/dL (ref 0.0–40.0)

## 2023-06-12 LAB — ALT: ALT: 11 U/L (ref 0–35)

## 2023-06-12 LAB — VITAMIN D 25 HYDROXY (VIT D DEFICIENCY, FRACTURES): VITD: 49.86 ng/mL (ref 30.00–100.00)

## 2023-06-12 LAB — TSH: TSH: 0.99 u[IU]/mL (ref 0.35–5.50)

## 2023-06-12 LAB — AST: AST: 15 U/L (ref 0–37)

## 2023-06-12 NOTE — Patient Instructions (Addendum)
For arthritic pain: - Tylenol (acetaminophen)   500 mg OTC 2 tabs a day every 8 hours as needed for pain - IBUPROFEN (Advil or Motrin) minimize it use 200 mg 2 tablets a day .  Always take it with food because may cause gastritis and ulcers.  If you notice nausea, stomach pain, change in the color of stools --->  Stop the medicine and let us know    Vaccines I recommend:  Covid booster Shingrix (shingles RSV vaccine Flu shot every fall  Check the  blood pressure    Be sure your blood pressure is between 110/65 and  145/85.  if it is consistently higher or lower, let me know  To find a good quality blood pressure cuff: [employer]  Next bone density test around November 2024  Next echocardiogram around 06-2024 Next colonoscopy 2030   "Health Care Power of attorney" ,  "Living will" (Advance care planning documents)  If you already have a living will or healthcare power of attorney, is recommended you bring the copy to be scanned in your chart.   The document will be available to all the doctors you see in the system.  Advance care planning is a process that supports adults in  understanding and sharing their preferences regarding future medical care.  The patient's preferences are recorded in documents called Advance Directives and the can be modified at any time while the patient is in full mental capacity.   If you don't have one, please consider create one.      More information at: [email or web address]

## 2023-06-12 NOTE — Assessment & Plan Note (Signed)
Tdap 2018 PNM 23: 2021; PNM 13:2020 Zostavax : 2017 Vaccines I recommend: Shingrix, RSV, COVID booster, flu shot every fall Sees gynecology [facility] 04-09-22 ([facility]) Colonoscopy 07/2019, next 10 years per colonoscopy report Healthcare POA: See AVS

## 2023-06-12 NOTE — Assessment & Plan Note (Signed)
HTN: Currently on amlodipine only, used to take a diuretic but has not doing so in a while.  BP is normal.  Recommend no change, checking labs, start checking BPs at home Hypothyroidism: On Synthroid, check TSH. Osteoporosis: Next DEXA 10-2023.  Patient aware. Aortic regurgitation: Last echo 06-2022: Normal EF, MV normal, aortic valve  thickening, mild regurgitation, no stenosis No symptoms, recommend echo around 06/2024 Insomnia: On Xanax as needed only.  RF as needed. DJD: See history and physical, recommend Tylenol and sporadic NSAID. Social: This is probably her last visit at this office, she is planning to find a primary doctor where she lives which is hours away.  She knows  can return to the office anytime. Preventive care reviewed RTC 6 months or prn

## 2023-06-12 NOTE — Progress Notes (Signed)
Subjective:    Patient ID: Linda Gay, female    DOB: 1956-08-29, 67 y.o.   MRN: [id number]  DOS:  06/12/2023 Type of visit - description: Follow-up  Chronic medical problems addressed. Also complains of pain at the hands, slightly more noticeable lately.  Denies any joint been puffy or red. Feet get slightly stiff early in the morning. No fever or chills.  No headaches.  No weight loss.  No myalgias.  History of aortic regurgitation.  Denies chest pain difficulty breathing or edema.  No nausea vomiting.  No cough  Review of Systems See above   Past Medical History:  Diagnosis Date   Hypertension    Hypothyroidism    Osteoporosis    Thyroid disease     Past Surgical History:  Procedure Laterality Date   BREAST BIOPSY Left 01/30/2016   CATARACT EXTRACTION Bilateral    COLONOSCOPY     12 yrs ago [place] [facility]   HEMORRHOID SURGERY     TONSILLECTOMY     Social History   Socioeconomic History   Marital status: Widowed    Spouse name: Not on file   Number of children: Not on file   Years of education: Not on file   Highest education level: Not on file  Occupational History   Occupation: part time [employer]  Tobacco Use   Smoking status: Never   Smokeless tobacco: Never  Vaping Use   Vaping Use: Never used  Substance and Sexual Activity   Alcohol use: Yes    Comment: wine daily 1-2 glasses a dat    Drug use: No   Sexual activity: Yes    Partners: Male  Other Topics Concern   Not on file  Social History Narrative   [place]    Retired    Takes care of her elderly mother who lives nearby   Has step children   Social Determinants of Health   Financial Resource Strain: Low Risk  (11/02/2021)   Overall Financial Resource Strain (CARDIA)    Difficulty of Paying Living Expenses: Not hard at all  Food Insecurity: No Food Insecurity (11/14/2022)   Hunger Vital Sign    Worried About Running Out of Food in the Last Year: Never true     Ran Out of Food in the Last Year: Never true  Transportation Needs: No Transportation Needs (11/14/2022)   PRAPARE - Administrator, Civil Service (Medical): No    Lack of Transportation (Non-Medical): No  Physical Activity: Inactive (11/02/2021)   Exercise Vital Sign    Days of Exercise per Week: 0 days    Minutes of Exercise per Session: 0 min  Stress: No Stress Concern Present (11/02/2021)   [employer] of Occupational Health - Occupational Stress Questionnaire    Feeling of Stress : Not at all  Social Connections: Socially Isolated (11/02/2021)   Social Connection and Isolation Panel [NHANES]    Frequency of Communication with Friends and Family: More than three times a week    Frequency of Social Gatherings with Friends and Family: More than three times a week    Attends Religious Services: Never    Database administrator or Organizations: No    Attends Banker Meetings: Never    Marital Status: Widowed  Intimate Partner Violence: Not At Risk (11/14/2022)   Humiliation, Afraid, Rape, and Kick questionnaire    Fear of Current or Ex-Partner: No    Emotionally  Abused: No    Physically Abused: No    Sexually Abused: No  ' Current Outpatient Medications  Medication Instructions   ALPRAZolam (XANAX) 0.5 MG tablet TAKE 1 TABLET BY MOUTH EVERY NIGHT AT BEDTIME AS NEEDED FOR ANXIETY   amLODipine (NORVASC) 5 mg, Oral, Daily   levothyroxine (SYNTHROID) 112 mcg, Oral, Daily before breakfast       Objective:   Physical Exam BP 116/62 (BP Location: Left Arm, Patient Position: Sitting, Cuff Size: Normal)   Pulse 62   Temp 98.9 F (37.2 C) (Oral)   Resp 20   Wt 131 lb (59.4 kg)   SpO2 98%   BMI 23.21 kg/m  General: Well developed, NAD, BMI noted Neck: No  thyromegaly  HEENT:  Normocephalic . Face symmetric, atraumatic Lungs:  CTA B Normal respiratory effort, no intercostal retractions, no accessory muscle use. Heart: RRR,  no murmur.   Abdomen:  Not distended, soft, non-tender. No rebound or rigidity.   Lower extremities: no pretibial edema bilaterally Hands: Bony changes consistent with DJD, no redness or swelling Skin: Exposed areas without rash. Not pale. Not jaundice Neurologic:  alert & oriented X3.  Speech normal, gait appropriate for age and unassisted Strength symmetric and appropriate for age.  Psych: Cognition and judgment appear intact.  Cooperative with normal attention span and concentration.  Behavior appropriate. No anxious or depressed appearing.     Assessment    ASSESSMENT(new patient 01-2020, transfer) HTN DX 11/2020. Hypothyroidism Osteoporosis:   -07-2019 T score -2.6. s/p fosamax x ~ 18 months, self d/c d/t aches  -November 2022: T score -2.4, pt elected recheck dexa in 2 years Insomnia Aortic regurgitation, Echo 06-11-2022 Normal EF, MV normal, aortic valve trichoscopy, thickening, mild regurgitation, no stenosis SCC  PLAN HTN: Currently on amlodipine only, used to take a diuretic but has not doing so in a while.  BP is normal.  Recommend no change, checking labs, start checking BPs at home Hypothyroidism: On Synthroid, check TSH. Osteoporosis: Next DEXA 10-2023.  Patient aware. Aortic regurgitation: Last echo 06-2022: Normal EF, MV normal, aortic valve  thickening, mild regurgitation, no stenosis No symptoms, recommend echo around 06/2024 Insomnia: On Xanax as needed only.  RF as needed. DJD: See history and physical, recommend Tylenol and sporadic NSAID. Social: This is probably her last visit at this office, she is planning to find a primary doctor where she lives which is hours away.  She knows can return to the office anytime. Preventive care reviewed Tdap 2018 PNM 23: 2021; PNM 13:2020 Zostavax : 2017 Vaccines I recommend: Shingrix, RSV, COVID booster, flu shot every fall Sees gynecology [facility] 04-09-22 ([facility]) Colonoscopy 07/2019, next 10 years per colonoscopy report Healthcare POA:  See AVS RTC 6 months or prn

## 2023-06-14 ENCOUNTER — Other Ambulatory Visit: Payer: Self-pay | Admitting: Internal Medicine

## 2023-06-14 DIAGNOSIS — R928 Other abnormal and inconclusive findings on diagnostic imaging of breast: Secondary | ICD-10-CM

## 2023-06-14 LAB — DM TEMPLATE

## 2023-06-14 LAB — DRUG MONITORING PANEL 375977 , URINE

## 2023-06-17 ENCOUNTER — Telehealth: Payer: Self-pay | Admitting: Internal Medicine

## 2023-06-18 NOTE — Telephone Encounter (Signed)
Note is empty.  Error

## 2023-07-25 ENCOUNTER — Telehealth: Payer: Self-pay

## 2023-07-25 NOTE — Telephone Encounter (Signed)
LMOM informing Pt that she still needs to get diagnostic mammogram for further imaging. Number for [facility] left on voicemail for her to call and schedule at her earliest convenience.

## 2023-09-23 ENCOUNTER — Ambulatory Visit
Admission: RE | Admit: 2023-09-23 | Discharge: 2023-09-23 | Disposition: A | Discharge status: Home / Self Care | Payer: Medicare Other | Source: Ambulatory Visit | Attending: Internal Medicine | Admitting: Internal Medicine

## 2023-09-23 DIAGNOSIS — R928 Other abnormal and inconclusive findings on diagnostic imaging of breast: Secondary | ICD-10-CM

## 2023-10-09 ENCOUNTER — Encounter: Payer: Self-pay | Admitting: Internal Medicine

## 2023-10-21 ENCOUNTER — Telehealth: Payer: Self-pay | Admitting: Internal Medicine

## 2023-10-21 NOTE — Telephone Encounter (Signed)
PDMP okay, Rx sent 

## 2023-10-21 NOTE — Telephone Encounter (Signed)
Requesting: alprazolam 0.5mg   Contract: 12/12/22 UDS: 06/12/23 Last Visit: 06/12/23 Next Visit: None Last Refill: 10/23/22 #30 and 2RF   Please Advise

## 2023-11-19 ENCOUNTER — Ambulatory Visit (INDEPENDENT_AMBULATORY_CARE_PROVIDER_SITE_OTHER): Payer: Medicare Other | Admitting: *Deleted

## 2023-11-19 DIAGNOSIS — Z0000 Encounter for general adult medical examination without abnormal findings: Secondary | ICD-10-CM

## 2023-11-19 NOTE — Patient Instructions (Signed)
Linda Gay , Thank you for taking time to come for your Medicare Wellness Visit. I appreciate your ongoing commitment to your health goals. Please review the following plan we discussed and let me know if I can assist you in the future.     This is a list of the screening recommended for you and due dates:  Health Maintenance  Topic Date Due   Zoster (Shingles) Vaccine (1 of 2) 04/29/1973   COVID-19 Vaccine (5 - 2024-25 season) 08/04/2023   Mammogram  06/11/2024   Medicare Annual Wellness Visit  11/18/2024   DTaP/Tdap/Td vaccine (3 - Td or Tdap) 09/16/2027   Colon Cancer Screening  07/30/2029   Pneumonia Vaccine  Completed   Flu Shot  Completed   DEXA scan (bone density measurement)  Completed   Hepatitis C Screening  Completed   HPV Vaccine  Aged Out    Next appointment: Follow up in one year for your annual wellness visit.   Preventive Care 43 Years and Older, Female Preventive care refers to lifestyle choices and visits with your health care provider that can promote health and wellness. What does preventive care include? A yearly physical exam. This is also called an annual well check. Dental exams once or twice a year. Routine eye exams. Ask your health care provider how often you should have your eyes checked. Personal lifestyle choices, including: Daily care of your teeth and gums. Regular physical activity. Eating a healthy diet. Avoiding tobacco and drug use. Limiting alcohol use. Practicing safe sex. Taking low-dose aspirin every day. Taking vitamin and mineral supplements as recommended by your health care provider. What happens during an annual well check? The services and screenings done by your health care provider during your annual well check will depend on your age, overall health, lifestyle risk factors, and family history of disease. Counseling  Your health care provider may ask you questions about your: Alcohol use. Tobacco use. Drug use. Emotional  well-being. Home and relationship well-being. Sexual activity. Eating habits. History of falls. Memory and ability to understand (cognition). Work and work Astronomer. Reproductive health. Screening  You may have the following tests or measurements: Height, weight, and BMI. Blood pressure. Lipid and cholesterol levels. These may be checked every 5 years, or more frequently if you are over 2 years old. Skin check. Lung cancer screening. You may have this screening every year starting at age 28 if you have a 30-pack-year history of smoking and currently smoke or have quit within the past 15 years. Fecal occult blood test (FOBT) of the stool. You may have this test every year starting at age 12. Flexible sigmoidoscopy or colonoscopy. You may have a sigmoidoscopy every 5 years or a colonoscopy every 10 years starting at age 94. Hepatitis C blood test. Hepatitis B blood test. Sexually transmitted disease (STD) testing. Diabetes screening. This is done by checking your blood sugar (glucose) after you have not eaten for a while (fasting). You may have this done every 1-3 years. Bone density scan. This is done to screen for osteoporosis. You may have this done starting at age 90. Mammogram. This may be done every 1-2 years. Talk to your health care provider about how often you should have regular mammograms. Talk with your health care provider about your test results, treatment options, and if necessary, the need for more tests. Vaccines  Your health care provider may recommend certain vaccines, such as: Influenza vaccine. This is recommended every year. Tetanus, diphtheria, and acellular pertussis (Tdap,  Td) vaccine. You may need a Td booster every 10 years. Zoster vaccine. You may need this after age 47. Pneumococcal 13-valent conjugate (PCV13) vaccine. One dose is recommended after age 81. Pneumococcal polysaccharide (PPSV23) vaccine. One dose is recommended after age 86. Talk to your  health care provider about which screenings and vaccines you need and how often you need them. This information is not intended to replace advice given to you by your health care provider. Make sure you discuss any questions you have with your health care provider. Document Released: 12/16/2015 Document Revised: 08/08/2016 Document Reviewed: 09/20/2015 Elsevier Interactive Patient Education  2017 [employer].  Fall Prevention in the Home Falls can cause injuries. They can happen to people of all ages. There are many things you can do to make your home safe and to help prevent falls. What can I do on the outside of my home? Regularly fix the edges of walkways and driveways and fix any cracks. Remove anything that might make you trip as you walk through a door, such as a raised step or threshold. Trim any bushes or trees on the path to your home. Use bright outdoor lighting. Clear any walking paths of anything that might make someone trip, such as rocks or tools. Regularly check to see if handrails are loose or broken. Make sure that both sides of any steps have handrails. Any raised decks and porches should have guardrails on the edges. Have any leaves, snow, or ice cleared regularly. Use sand or salt on walking paths during winter. Clean up any spills in your garage right away. This includes oil or grease spills. What can I do in the bathroom? Use night lights. Install grab bars by the toilet and in the tub and shower. Do not use towel bars as grab bars. Use non-skid mats or decals in the tub or shower. If you need to sit down in the shower, use a plastic, non-slip stool. Keep the floor dry. Clean up any water that spills on the floor as soon as it happens. Remove soap buildup in the tub or shower regularly. Attach bath mats securely with double-sided non-slip rug tape. Do not have throw rugs and other things on the floor that can make you trip. What can I do in the bedroom? Use night  lights. Make sure that you have a light by your bed that is easy to reach. Do not use any sheets or blankets that are too big for your bed. They should not hang down onto the floor. Have a firm chair that has side arms. You can use this for support while you get dressed. Do not have throw rugs and other things on the floor that can make you trip. What can I do in the kitchen? Clean up any spills right away. Avoid walking on wet floors. Keep items that you use a lot in easy-to-reach places. If you need to reach something above you, use a strong step stool that has a grab bar. Keep electrical cords out of the way. Do not use floor polish or wax that makes floors slippery. If you must use wax, use non-skid floor wax. Do not have throw rugs and other things on the floor that can make you trip. What can I do with my stairs? Do not leave any items on the stairs. Make sure that there are handrails on both sides of the stairs and use them. Fix handrails that are broken or loose. Make sure that handrails are as  long as the stairways. Check any carpeting to make sure that it is firmly attached to the stairs. Fix any carpet that is loose or worn. Avoid having throw rugs at the top or bottom of the stairs. If you do have throw rugs, attach them to the floor with carpet tape. Make sure that you have a light switch at the top of the stairs and the bottom of the stairs. If you do not have them, ask someone to add them for you. What else can I do to help prevent falls? Wear shoes that: Do not have high heels. Have rubber bottoms. Are comfortable and fit you well. Are closed at the toe. Do not wear sandals. If you use a stepladder: Make sure that it is fully opened. Do not climb a closed stepladder. Make sure that both sides of the stepladder are locked into place. Ask someone to hold it for you, if possible. Clearly mark and make sure that you can see: Any grab bars or handrails. First and last  steps. Where the edge of each step is. Use tools that help you move around (mobility aids) if they are needed. These include: Canes. Walkers. Scooters. Crutches. Turn on the lights when you go into a dark area. Replace any light bulbs as soon as they burn out. Set up your furniture so you have a clear path. Avoid moving your furniture around. If any of your floors are uneven, fix them. If there are any pets around you, be aware of where they are. Review your medicines with your doctor. Some medicines can make you feel dizzy. This can increase your chance of falling. Ask your doctor what other things that you can do to help prevent falls. This information is not intended to replace advice given to you by your health care provider. Make sure you discuss any questions you have with your health care provider. Document Released: 09/15/2009 Document Revised: 04/26/2016 Document Reviewed: 12/24/2014 Elsevier Interactive Patient Education  2017 [employer].

## 2023-11-19 NOTE — Progress Notes (Signed)
Subjective:   Linda Gay is a 67 y.o. female who presents for Medicare Annual (Subsequent) preventive examination.  Visit Complete: Virtual I connected with  Radie H Dube on 11/19/23 by a audio enabled telemedicine application and verified that I am speaking with the correct person using two identifiers.  Patient Location: Home  Provider Location: Office/Clinic  I discussed the limitations of evaluation and management by telemedicine. The patient expressed understanding and agreed to proceed.  Vital Signs: Because this visit was a virtual/telehealth visit, some criteria may be missing or patient reported. Any vitals not documented were not able to be obtained and vitals that have been documented are patient reported.   Cardiac Risk Factors include: advanced age (>88men, >4 women);hypertension     Objective:    There were no vitals filed for this visit. There is no height or weight on file to calculate BMI.     11/19/2023    1:04 PM 11/14/2022    9:42 AM 12/22/2021    8:39 PM 11/02/2021    3:44 PM  Advanced Directives  Does Patient Have a Medical Advance Directive? No No Yes No  Type of Advance Directive   Living will;Healthcare Power of Attorney   Would patient like information on creating a medical advance directive? No - Patient declined No - Patient declined No - Patient declined Yes (MAU/Ambulatory/Procedural Areas - Information given)    Current Medications (verified) Outpatient Encounter Medications as of 11/19/2023  Medication Sig   ALPRAZolam (XANAX) 0.5 MG tablet TAKE 1 TABLET BY MOUTH EVERY NIGHT AT BEDTIME AS NEEDED FOR ANXIETY   amLODipine (NORVASC) 5 MG tablet Take 1 tablet (5 mg total) by mouth daily.   levothyroxine (SYNTHROID) 112 MCG tablet Take 1 tablet (112 mcg total) by mouth daily before breakfast.   No facility-administered encounter medications on file as of 11/19/2023.    Allergies (verified) Patient has no known allergies.   History: Past  Medical History:  Diagnosis Date   Hypertension    Hypothyroidism    Osteoporosis    Thyroid disease    Past Surgical History:  Procedure Laterality Date   BREAST BIOPSY Left 01/30/2016   CATARACT EXTRACTION Bilateral    COLONOSCOPY     12 yrs ago in [place]   HEMORRHOID SURGERY     TONSILLECTOMY     Family History  Problem Relation Age of Onset   Hypertension Mother    Prostate cancer Father    Hypertension Brother    Stroke Maternal Grandmother    Heart disease Maternal Grandmother    Cancer Maternal Grandfather        lung   Diabetes Paternal Grandmother    Cancer Paternal Grandfather        prostate   Prostate cancer Paternal Grandfather    Colon cancer Paternal Aunt    Prostate cancer Paternal Uncle    Colon polyps Neg Hx    Esophageal cancer Neg Hx    Rectal cancer Neg Hx    Stomach cancer Neg Hx    Social History   Socioeconomic History   Marital status: Widowed    Spouse name: Not on file   Number of children: Not on file   Years of education: Not on file   Highest education level: Not on file  Occupational History   Occupation: part time [employer]  Tobacco Use   Smoking status: Never   Smokeless tobacco: Never  Vaping Use   Vaping status: Never Used  Substance and Sexual Activity   Alcohol use: Yes    Comment: wine daily 1-2 glasses a dat    Drug use: No   Sexual activity: Yes    Partners: Male  Other Topics Concern   Not on file  Social History Narrative   [place]    Retired    Takes care of her elderly mother who lives nearby   Has step children   Social Drivers of Corporate investment banker Strain: Low Risk  (11/19/2023)   Overall Financial Resource Strain (CARDIA)    Difficulty of Paying Living Expenses: Not hard at all  Food Insecurity: No Food Insecurity (11/19/2023)   Hunger Vital Sign    Worried About Running Out of Food in the Last Year: Never true    Ran Out of Food in the Last Year: Never  true  Transportation Needs: No Transportation Needs (11/19/2023)   PRAPARE - Administrator, Civil Service (Medical): No    Lack of Transportation (Non-Medical): No  Physical Activity: Inactive (11/19/2023)   Exercise Vital Sign    Days of Exercise per Week: 0 days    Minutes of Exercise per Session: 0 min  Stress: No Stress Concern Present (11/19/2023)   [employer] of Occupational Health - Occupational Stress Questionnaire    Feeling of Stress : Only a little  Social Connections: Moderately Isolated (11/19/2023)   Social Connection and Isolation Panel [NHANES]    Frequency of Communication with Friends and Family: More than three times a week    Frequency of Social Gatherings with Friends and Family: Three times a week    Attends Religious Services: More than 4 times per year    Active Member of Clubs or Organizations: No    Attends Banker Meetings: Never    Marital Status: Widowed    Tobacco Counseling Counseling given: Not Answered   Clinical Intake:  Pre-visit preparation completed: Yes  Pain : No/denies pain  Nutritional Risks: None Diabetes: No  How often do you need to have someone help you when you read instructions, pamphlets, or other written materials from your doctor or pharmacy?: 1 - Never  Interpreter Needed?: No  Information entered by :: Donne Anon, CMA   Activities of Daily Living    11/19/2023    1:01 PM  In your present state of health, do you have any difficulty performing the following activities:  Hearing? 0  Vision? 0  Comment just wears readers after cataract surgery  Difficulty concentrating or making decisions? 0  Walking or climbing stairs? 0  Dressing or bathing? 0  Doing errands, shopping? 0  Preparing Food and eating ? N  Using the Toilet? N  In the past six months, have you accidently leaked urine? N  Do you have problems with loss of bowel control? N  Managing your Medications? N  Managing  your Finances? N  Housekeeping or managing your Housekeeping? N    Patient Care Team: Wanda Plump, MD as PCP - General (Internal Medicine) Dulce Sellar Iline Oven, MD as PCP - Cardiology (Cardiology) Armbruster, Willaim Rayas, MD as Consulting Physician (Gastroenterology) [place], Heather, OD (Optometry)  Indicate any recent Medical Services you may have received from other than [facility] providers in the past year (date may be approximate).     Assessment:   This is a routine wellness examination for Merlin.  Hearing/Vision screen No results found.   Goals Addressed   None  Depression Screen    11/19/2023    1:14 PM 06/12/2023    8:49 AM 12/12/2022   10:12 AM 11/14/2022    9:44 AM 06/11/2022    8:25 AM 11/02/2021    3:50 PM 09/11/2021    2:16 PM  PHQ 2/9 Scores  PHQ - 2 Score 0 0 0 0 0 0 0    Fall Risk    11/19/2023    1:04 PM 06/12/2023    8:49 AM 12/12/2022   10:12 AM 11/14/2022    9:42 AM 06/11/2022    8:24 AM  Fall Risk   Falls in the past year? 0 0 0 0 0  Number falls in past yr: 0 0 0 0 0  Injury with Fall? 0 0 0 0 0  Risk for fall due to : No Fall Risks   No Fall Risks   Follow up Falls evaluation completed Falls evaluation completed Falls evaluation completed Falls evaluation completed Falls evaluation completed    MEDICARE RISK AT HOME: Medicare Risk at Home Any stairs in or around the home?: Yes (steps going into home) If so, are there any without handrails?: No Home free of loose throw rugs in walkways, pet beds, electrical cords, etc?: Yes Adequate lighting in your home to reduce risk of falls?: Yes Life alert?: No Use of a cane, walker or w/c?: No Grab bars in the bathroom?: No Shower chair or bench in shower?: Yes (built in) Elevated toilet seat or a handicapped toilet?: Yes (comfort height)  TIMED UP AND GO:  Was the test performed?  No    Cognitive Function:        11/19/2023    1:14 PM 11/14/2022    9:50 AM  6CIT Screen  What Year? 0 points 0 points   What month? 0 points 0 points  What time? 0 points 0 points  Count back from 20 0 points 0 points  Months in reverse 0 points 0 points  Repeat phrase 0 points 0 points  Total Score 0 points 0 points    Immunizations Immunization History  Administered Date(s) Administered   Fluad Quad(high Dose 65+) 09/11/2021   Influenza Whole 09/14/2010, 09/07/2011   Influenza, High Dose Seasonal PF 09/16/2020   Influenza, Quadrivalent, Recombinant, Inj, Pf 09/16/2017   Influenza,inj,Quad PF,6+ Mos 09/04/2013, 09/18/2014, 12/27/2015, 11/12/2018   Influenza-Unspecified 09/29/2016, 09/03/2019, 09/18/2022, 07/28/2023   PFIZER(Purple Top)SARS-COV-2 Vaccination 02/05/2020, 03/02/2020, 10/10/2020   Pfizer Covid-19 Vaccine Bivalent Booster 83yrs & up 09/11/2021   Pneumococcal Conjugate-13 10/12/2019   Pneumococcal Polysaccharide-23 11/11/2020, 07/28/2023   Td 10/05/2010   Tdap 09/15/2017   Zoster, Live 05/10/2016    TDAP status: Up to date  Flu Vaccine status: Up to date  Pneumococcal vaccine status: Up to date  Covid-19 vaccine status: Information provided on how to obtain vaccines.   Qualifies for Shingles Vaccine? Yes   Zostavax completed Yes   Shingrix Completed?: No.    Education has been provided regarding the importance of this vaccine. Patient has been advised to call insurance company to determine out of pocket expense if they have not yet received this vaccine. Advised may also receive vaccine at local pharmacy or Health Dept. Verbalized acceptance and understanding.  Screening Tests Health Maintenance  Topic Date Due   Zoster Vaccines- Shingrix (1 of 2) 04/29/1973   COVID-19 Vaccine (5 - 2024-25 season) 08/04/2023   Medicare Annual Wellness (AWV)  11/15/2023   MAMMOGRAM  06/11/2024   DTaP/Tdap/Td (3 - Td or Tdap) 09/16/2027  Colonoscopy  07/30/2029   Pneumonia Vaccine 61+ Years old  Completed   INFLUENZA VACCINE  Completed   DEXA SCAN  Completed   Hepatitis C Screening   Completed   HPV VACCINES  Aged Out    Health Maintenance  Health Maintenance Due  Topic Date Due   Zoster Vaccines- Shingrix (1 of 2) 04/29/1973   COVID-19 Vaccine (5 - 2024-25 season) 08/04/2023   Medicare Annual Wellness (AWV)  11/15/2023    Colorectal cancer screening: Type of screening: Colonoscopy. Completed 07/31/19. Repeat every 10 years  Mammogram status: Completed 06/12/23. Repeat every year  Bone Density status: Completed 10/23/21. Results reflect: Bone density results: OSTEOPENIA. Repeat every 2 years.  Lung Cancer Screening: (Low Dose CT Chest recommended if Age 55-80 years, 20 pack-year currently smoking OR have quit w/in 15years.) does not qualify.   Additional Screening:  Hepatitis C Screening: does qualify; Completed 12/27/15  Vision Screening: Recommended annual ophthalmology exams for early detection of glaucoma and other disorders of the eye. Is the patient up to date with their annual eye exam?  No  Who is the provider or what is the name of the office in which the patient attends annual eye exams? Dr. Chilton Si If pt is not established with a provider, would they like to be referred to a provider to establish care? No .   Dental Screening: Recommended annual dental exams for proper oral hygiene  Diabetic Foot Exam: N/a  Community Resource Referral / Chronic Care Management: CRR required this visit?  No   CCM required this visit?  No     Plan:     I have personally reviewed and noted the following in the patient's chart:   Medical and social history Use of alcohol, tobacco or illicit drugs  Current medications and supplements including opioid prescriptions. Patient is not currently taking opioid prescriptions. Functional ability and status Nutritional status Physical activity Advanced directives List of other physicians Hospitalizations, surgeries, and ER visits in previous 12 months Vitals Screenings to include cognitive, depression, and  falls Referrals and appointments  In addition, I have reviewed and discussed with patient certain preventive protocols, quality metrics, and best practice recommendations. A written personalized care plan for preventive services as well as general preventive health recommendations were provided to patient.     Donne Anon, CMA   11/19/2023   After Visit Summary: (MyChart) Due to this being a telephonic visit, the after visit summary with patients personalized plan was offered to patient via MyChart   Nurse Notes: None

## 2024-07-24 ENCOUNTER — Other Ambulatory Visit: Payer: Self-pay | Admitting: Internal Medicine

## 2024-07-24 NOTE — Telephone Encounter (Signed)
 Per chart- Pt has established w/   Service Providers Document Coverage Dates  [facility] (Primary Care Provider) [phone] (Work) [address] [place], [place] [address]  [phone] (Work) [address] [place], [place] [address]
